# Patient Record
Sex: Female | Born: 1995 | Race: White | Hispanic: No | Marital: Single | Smoking: Former smoker
Health system: Southern US, Community
[De-identification: ages and names within clinical notes are randomized; demographics above are authoritative.]

## PROBLEM LIST (undated history)

## (undated) ENCOUNTER — Inpatient Hospital Stay: Payer: Self-pay

## (undated) DIAGNOSIS — N200 Calculus of kidney: Secondary | ICD-10-CM

## (undated) DIAGNOSIS — R569 Unspecified convulsions: Secondary | ICD-10-CM

## (undated) DIAGNOSIS — F32A Depression, unspecified: Secondary | ICD-10-CM

## (undated) DIAGNOSIS — D649 Anemia, unspecified: Secondary | ICD-10-CM

## (undated) DIAGNOSIS — F419 Anxiety disorder, unspecified: Secondary | ICD-10-CM

## (undated) DIAGNOSIS — J45909 Unspecified asthma, uncomplicated: Secondary | ICD-10-CM

## (undated) DIAGNOSIS — F329 Major depressive disorder, single episode, unspecified: Secondary | ICD-10-CM

## (undated) DIAGNOSIS — Z87442 Personal history of urinary calculi: Secondary | ICD-10-CM

## (undated) DIAGNOSIS — K219 Gastro-esophageal reflux disease without esophagitis: Secondary | ICD-10-CM

## (undated) HISTORY — PX: TONSILLECTOMY: SUR1361

## (undated) HISTORY — PX: WISDOM TOOTH EXTRACTION: SHX21

---
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
## 2015-12-30 ENCOUNTER — Emergency Department: Payer: Self-pay

## 2015-12-30 ENCOUNTER — Encounter: Payer: Self-pay | Admitting: Emergency Medicine

## 2015-12-30 ENCOUNTER — Emergency Department
Admission: EM | Admit: 2015-12-30 | Discharge: 2015-12-30 | Disposition: A | Discharge status: Home / Self Care | Payer: Self-pay | Attending: Emergency Medicine | Admitting: Emergency Medicine

## 2015-12-30 DIAGNOSIS — F17200 Nicotine dependence, unspecified, uncomplicated: Secondary | ICD-10-CM | POA: Insufficient documentation

## 2015-12-30 DIAGNOSIS — O021 Missed abortion: Secondary | ICD-10-CM | POA: Insufficient documentation

## 2015-12-30 LAB — URINALYSIS COMPLETE WITH MICROSCOPIC (ARMC ONLY)
Bacteria, UA: NONE SEEN
Bilirubin Urine: NEGATIVE
Glucose, UA: NEGATIVE mg/dL
Ketones, ur: NEGATIVE mg/dL
Leukocytes, UA: NEGATIVE
Nitrite: NEGATIVE
Protein, ur: NEGATIVE mg/dL
Specific Gravity, Urine: 1.004 — ABNORMAL LOW (ref 1.005–1.030)
WBC, UA: NONE SEEN WBC/hpf (ref 0–5)
pH: 7 (ref 5.0–8.0)

## 2015-12-30 LAB — COMPREHENSIVE METABOLIC PANEL WITH GFR
ALT: 13 U/L — ABNORMAL LOW (ref 14–54)
AST: 24 U/L (ref 15–41)
Albumin: 4 g/dL (ref 3.5–5.0)
Alkaline Phosphatase: 68 U/L (ref 38–126)
Anion gap: 9 (ref 5–15)
BUN: 10 mg/dL (ref 6–20)
CO2: 22 mmol/L (ref 22–32)
Calcium: 9 mg/dL (ref 8.9–10.3)
Chloride: 105 mmol/L (ref 101–111)
Creatinine, Ser: 0.55 mg/dL (ref 0.44–1.00)
GFR calc Af Amer: >60 mL/min
GFR calc non Af Amer: >60 mL/min
Glucose, Bld: 86 mg/dL (ref 65–99)
Potassium: 3.4 mmol/L — ABNORMAL LOW (ref 3.5–5.1)
Sodium: 136 mmol/L (ref 135–145)
Total Bilirubin: 1 mg/dL (ref 0.3–1.2)
Total Protein: 7.1 g/dL (ref 6.5–8.1)

## 2015-12-30 LAB — CBC
HEMATOCRIT: 39.2 % (ref 35.0–47.0)
Hemoglobin: 13.4 g/dL (ref 12.0–16.0)
MCH: 29.7 pg (ref 26.0–34.0)
MCHC: 34.3 g/dL (ref 32.0–36.0)
MCV: 86.7 fL (ref 80.0–100.0)
PLATELETS: 251 10*3/uL (ref 150–440)
RBC: 4.52 MIL/uL (ref 3.80–5.20)
RDW: 13.2 % (ref 11.5–14.5)
WBC: 7.3 10*3/uL (ref 3.6–11.0)

## 2015-12-30 LAB — POCT PREGNANCY, URINE: Preg Test, Ur: NEGATIVE

## 2015-12-30 LAB — HCG, QUANTITATIVE, PREGNANCY: hCG, Beta Chain, Quant, S: 439 m[IU]/mL — ABNORMAL HIGH (ref <5)

## 2015-12-30 NOTE — ED Notes (Signed)
Pt to ed with c/o left lower quad pain, states worse after eating,  Reports she is approx [redacted] weeks pregnant.

## 2015-12-30 NOTE — ED Provider Notes (Signed)
[facility] Emergency Department Provider Note  ____________________________________________    I have reviewed the triage vital signs and the nursing notes.   HISTORY  Chief Complaint Abdominal Pain    HPI Erica Gill is a 20 y.o. female presents with left lower abdominal discomfort for approximately one day. She describes it as cramping and intermittent She believes that she is 5-[redacted] weeks pregnant. She had a positive urine pregnancy 4 days ago. She denies vaginal bleeding. No fevers or chills. This is her first pregnancy     History reviewed. No pertinent past medical history.  There are no active problems to display for this patient.   History reviewed. No pertinent past surgical history.  No current outpatient prescriptions on file.  Allergies Review of patient's allergies indicates no known allergies.  History reviewed. No pertinent family history.  Social History Social History  Substance Use Topics  . Smoking status: Current Every Day Smoker  . Smokeless tobacco: None  . Alcohol Use: No    Review of Systems  Constitutional: Negative for fever. Eyes: Negative for redness ENT: Negative for sore throat Cardiovascular: Negative for chest pain Respiratory: Negative for cough Gastrointestinal: Negative for diarrhea Genitourinary: Negative for dysuria. No vaginal discharge Musculoskeletal: Negative for back pain. Skin: Negative for rash. Neurological: Negative for focal weakness Psychiatric: no anxiety    ____________________________________________   PHYSICAL EXAM:  VITAL SIGNS: ED Triage Vitals  Enc Vitals Group     BP 12/30/15 1243 134/82 mmHg     Pulse Rate 12/30/15 1243 102     Resp 12/30/15 1243 18     Temp 12/30/15 1243 98 F (36.7 C)     Temp Source 12/30/15 1243 Oral     SpO2 12/30/15 1243 100 %     Weight 12/30/15 1243 149 lb (67.586 kg)     Height 12/30/15 1243  (1.549 m)     Head Cir --      Peak  Flow --      Pain Score 12/30/15 1244 4     Pain Loc --      Pain Edu? --      Excl. in GC? --      Constitutional: Alert and oriented. Well appearing and in no distress.  Eyes: Conjunctivae are normal. No erythema or injection ENT   Head: Normocephalic and atraumatic.   Mouth/Throat: Mucous membranes are moist. Cardiovascular: Normal rate, regular rhythm.  Respiratory: Normal respiratory effort without tachypnea nor retractions.  Gastrointestinal: Soft and non-tender in all quadrants. No distention. There is no CVA tenderness. Genitourinary: deferred Musculoskeletal: Nontender with normal range of motion in all extremities. No lower extremity tenderness nor edema. Neurologic:  Normal speech and language. No gross focal neurologic deficits are appreciated. Skin:  Skin is warm, dry and intact. No rash noted. Psychiatric: Mood and affect are normal. Patient exhibits appropriate insight and judgment.  ____________________________________________    LABS (pertinent positives/negatives)  Labs Reviewed  COMPREHENSIVE METABOLIC PANEL - Abnormal; Notable for the following:    Potassium 3.4 (*)    ALT 13 (*)    All other components within normal limits  URINALYSIS COMPLETEWITH MICROSCOPIC ([facility] ONLY) - Abnormal; Notable for the following:    Color, Urine STRAW (*)    APPearance CLEAR (*)    Specific Gravity, Urine 1.004 (*)    Hgb urine dipstick 1+ (*)    Squamous Epithelial / LPF 0-5 (*)    All other components within normal limits  HCG, QUANTITATIVE,  PREGNANCY - Abnormal; Notable for the following:    hCG, Beta Chain, Quant, S 439 (*)    All other components within normal limits  CBC  POC URINE PREG, ED  POCT PREGNANCY, URINE  ABO/RH    ____________________________________________   EKG  None  ____________________________________________    RADIOLOGY  Ultrasound shows no IUP  ____________________________________________   PROCEDURES  Procedure(s)  performed: none  Critical Care performed: none  ____________________________________________   INITIAL IMPRESSION / ASSESSMENT AND PLAN / ED COURSE  Pertinent labs & imaging results that were available during my care of the patient were reviewed by me and considered in my medical decision making (see chart for details).  Discussed case with Dr. Vergie [facility] gynecology. He asks to see the patient as an outpatient and to have repeat beta hCG. She is Rh+. She apparently did develop some vaginal bleeding while in the ED. She appears comfortable in the exam room think is a reasonable plan. I did discuss with her the need to return immediately to the emergency department if worsening pain. I described to her that this may be a very early pregnancy, missed abortion, or ectopic pregnancy.  ____________________________________________   FINAL CLINICAL IMPRESSION(S) / ED DIAGNOSES  Final diagnoses:  Missed abortion          Jene Everyobert Alexei Doswell, MD 12/30/15 2046

## 2015-12-31 LAB — ABO/RH: ABO/RH(D): A POS

## 2016-01-07 ENCOUNTER — Other Ambulatory Visit: Payer: Self-pay | Admitting: Obstetrics and Gynecology

## 2016-01-07 ENCOUNTER — Other Ambulatory Visit
Admission: RE | Admit: 2016-01-07 | Discharge: 2016-01-07 | Disposition: A | Discharge status: Home / Self Care | Payer: Medicaid Other | Source: Ambulatory Visit | Attending: Obstetrics and Gynecology | Admitting: Obstetrics and Gynecology

## 2016-01-07 DIAGNOSIS — Z331 Pregnant state, incidental: Secondary | ICD-10-CM | POA: Insufficient documentation

## 2016-01-07 DIAGNOSIS — O3680X0 Pregnancy with inconclusive fetal viability, not applicable or unspecified: Secondary | ICD-10-CM

## 2016-01-07 LAB — HCG, QUANTITATIVE, PREGNANCY: hCG, Beta Chain, Quant, S: 15 m[IU]/mL — ABNORMAL HIGH (ref <5)

## 2016-10-05 NOTE — L&D Delivery Note (Signed)
Operative Delivery Note At  a viable female was delivered via VAVD kiwi ( see dictated note).  Presentation: vertex; Position: Right,, Occiput,, Anterior; Station: outlet.  Verbal consent: obtained from patient.  Risks and benefits discussed in detail.  Risks include, but are not limited to the risks of anesthesia, bleeding, infection, damage to maternal tissues, fetal cephalhematoma.  There is also the risk of inability to effect vaginal delivery of the head, or shoulder dystocia that cannot be resolved by established maneuvers, leading to the need for emergency cesarean section.  APGAR:8/9 , ; weight  .#8/0   Placenta status: intact, .   Cord:  with the following complications: .  Cord pH: not done  Anesthesia:  CLE  Instruments: Kiwi  Episiotomy:  MLE  Lacerations:  fourth degree laceration  Suture Repair: 2.0 3.0 vicryl Est. Blood Loss (mL):  350 cc  Mom to postpartum.  Baby to Couplet care / Skin to Skin.  Ihor Austin Schermerhorn 06/29/2017, 6:48 AM

## 2016-12-30 LAB — OB RESULTS CONSOLE HEPATITIS B SURFACE ANTIGEN: Hepatitis B Surface Ag: NEGATIVE

## 2016-12-30 LAB — OB RESULTS CONSOLE RUBELLA ANTIBODY, IGM: Rubella: IMMUNE

## 2016-12-30 LAB — OB RESULTS CONSOLE GC/CHLAMYDIA
CHLAMYDIA, DNA PROBE: NEGATIVE
GC PROBE AMP, GENITAL: NEGATIVE

## 2016-12-30 LAB — OB RESULTS CONSOLE HIV ANTIBODY (ROUTINE TESTING): HIV: NONREACTIVE

## 2016-12-30 LAB — OB RESULTS CONSOLE ABO/RH: RH TYPE: POSITIVE

## 2016-12-30 LAB — OB RESULTS CONSOLE ANTIBODY SCREEN: ANTIBODY SCREEN: NEGATIVE

## 2016-12-30 LAB — OB RESULTS CONSOLE VARICELLA ZOSTER ANTIBODY, IGG: Varicella: IMMUNE

## 2016-12-30 LAB — OB RESULTS CONSOLE RPR: RPR: NONREACTIVE

## 2017-04-04 ENCOUNTER — Encounter: Payer: Self-pay | Admitting: Emergency Medicine

## 2017-04-04 ENCOUNTER — Emergency Department
Admission: EM | Admit: 2017-04-04 | Discharge: 2017-04-04 | Disposition: A | Discharge status: Home / Self Care | Payer: Medicaid Other | Attending: Emergency Medicine | Admitting: Emergency Medicine

## 2017-04-04 DIAGNOSIS — F17200 Nicotine dependence, unspecified, uncomplicated: Secondary | ICD-10-CM | POA: Diagnosis not present

## 2017-04-04 DIAGNOSIS — R05 Cough: Secondary | ICD-10-CM | POA: Diagnosis present

## 2017-04-04 DIAGNOSIS — J069 Acute upper respiratory infection, unspecified: Secondary | ICD-10-CM

## 2017-04-04 DIAGNOSIS — O99332 Smoking (tobacco) complicating pregnancy, second trimester: Secondary | ICD-10-CM | POA: Insufficient documentation

## 2017-04-04 DIAGNOSIS — O99512 Diseases of the respiratory system complicating pregnancy, second trimester: Secondary | ICD-10-CM | POA: Diagnosis not present

## 2017-04-04 DIAGNOSIS — Z3A26 26 weeks gestation of pregnancy: Secondary | ICD-10-CM | POA: Insufficient documentation

## 2017-04-04 MED ORDER — AZITHROMYCIN 250 MG PO TABS: ORAL_TABLET | ORAL | 0 refills | Status: DC

## 2017-04-04 MED ORDER — ALBUTEROL SULFATE HFA 108 (90 BASE) MCG/ACT IN AERS: 2.0000 | INHALATION_SPRAY | Freq: Four times a day (QID) | RESPIRATORY_TRACT | 0 refills | Status: DC | PRN

## 2017-04-04 MED ORDER — ALBUTEROL SULFATE (2.5 MG/3ML) 0.083% IN NEBU
2.5000 mg | INHALATION_SOLUTION | Freq: Once | RESPIRATORY_TRACT | Status: AC
  Administered 2017-04-04: 2.5 mg via RESPIRATORY_TRACT
  Filled 2017-04-04: qty 3

## 2017-04-04 NOTE — ED Provider Notes (Signed)
[facility] Emergency Department Provider Note  ____________________________________________  Time seen: Approximately 12:46 PM  I have reviewed the triage vital signs and the nursing notes.   HISTORY  Chief Complaint URI    HPI Erica Gill is a 21 y.o. female that presents to the emergency departmentwith chills, nasal congestion, sore throat, productive cough for 5 days. Patient states that she has had a low-grade fever and the highest temperature has been 98.8. She is coughing up green sputum. No alleviating measures have been attempted. She has been exposed to several people with walking pneumonia. She is [redacted] weeks pregnant. No abdominal pain. She denies chest pain, nausea, vomiting, abdominal pain, vaginal discharge.   History reviewed. No pertinent past medical history.  There are no active problems to display for this patient.   History reviewed. No pertinent surgical history.  Prior to Admission medications   Medication Sig Start Date End Date Taking? Authorizing Provider  albuterol (PROVENTIL HFA;VENTOLIN HFA) 108 (90 Base) MCG/ACT inhaler Inhale 2 puffs into the lungs every 6 (six) hours as needed for wheezing or shortness of breath. 04/04/17   Enid DerryWagner, Elona Yinger, PA-C  azithromycin (ZITHROMAX Z-PAK) 250 MG tablet Take 2 tablets (500 mg) on  Day 1,  followed by 1 tablet (250 mg) once daily on Days 2 through 5. 04/04/17   Enid DerryWagner, Hans Rusher, PA-C    Allergies Patient has no known allergies.  No family history on file.  Social History Social History  Substance Use Topics  . Smoking status: Current Every Day Smoker  . Smokeless tobacco: Never Used  . Alcohol use No     Review of Systems  Constitutional: No chills Eyes: No visual changes. No discharge. ENT: Positive for congestion and rhinorrhea. Cardiovascular: No chest pain. Respiratory: Positive for cough.  Gastrointestinal: No abdominal pain.  No nausea, no vomiting.  No diarrhea.  No  constipation. Skin: Negative for rash, abrasions, lacerations, ecchymosis. Neurological: Negative for headaches.   ____________________________________________   PHYSICAL EXAM:  VITAL SIGNS: ED Triage Vitals  Enc Vitals Group     BP 04/04/17 1008 110/74     Pulse Rate 04/04/17 1008 (!) 108     Resp 04/04/17 1008 16     Temp 04/04/17 1008 98.4 F (36.9 C)     Temp Source 04/04/17 1008 Oral     SpO2 04/04/17 1008 99 %     Weight 04/04/17 0954 160 lb (72.6 kg)     Height 04/04/17 0954 5\' 1"  (1.549 m)     Head Circumference --      Peak Flow --      Pain Score 04/04/17 0954 5     Pain Loc --      Pain Edu? --      Excl. in GC? --      Constitutional: Alert and oriented. Well appearing and in no acute distress. Eyes: Conjunctivae are normal. PERRL. EOMI. No discharge. Head: Atraumatic. ENT: No frontal and maxillary sinus tenderness.      Ears: Tympanic membranes pearly gray with good landmarks. No discharge.      Nose: No congestion/rhinnorhea.      Mouth/Throat: Mucous membranes are moist. Oropharynx non-erythematous. Tonsils not enlarged. No exudates. Uvula midline. Neck: No stridor.   Hematological/Lymphatic/Immunilogical: No cervical lymphadenopathy. Cardiovascular: Normal rate, regular rhythm.  Good peripheral circulation. Respiratory: Normal respiratory effort without tachypnea or retractions. Lungs CTAB. Good air entry to the bases with no decreased or absent breath sounds. Gastrointestinal: Bowel sounds 4 quadrants. Soft  and nontender to palpation. No guarding or rigidity. No palpable masses.  Musculoskeletal: Full range of motion to all extremities. No gross deformities appreciated. Neurologic:  Normal speech and language. No gross focal neurologic deficits are appreciated.  Skin:  Skin is warm, dry and intact. No rash noted.   ____________________________________________   LABS (all labs ordered are listed, but only abnormal results are displayed)  Labs  Reviewed - No data to display ____________________________________________  EKG   ____________________________________________  RADIOLOGY  No results found.  ____________________________________________    PROCEDURES  Procedure(s) performed:    Procedures    Medications  albuterol (PROVENTIL) (2.5 MG/3ML) 0.083% nebulizer solution 2.5 mg (2.5 mg Nebulization Given 04/04/17 1145)     ____________________________________________   INITIAL IMPRESSION / ASSESSMENT AND PLAN / ED COURSE  Pertinent labs & imaging results that were available during my care of the patient were reviewed by me and considered in my medical decision making (see chart for details).  Review of the  [place]RS was performed in accordance of the [place] prior to dispensing any controlled drugs.   Patient's diagnosis is consistent with upper respiratory infection. Vital signs and exam are reassuring. Patient is afebrile. She is [redacted] weeks pregnant. Fetal heart tones heard at 150bpm.  I will cover for bacterial causes. Nebulizer helped her breathing. Patient appears well and is staying well hydrated. Patient should alternate tylenol and ibuprofen for fever. Patient feels comfortable going home. Patient will be discharged home with prescriptions for azithromycin and albuterol. Patient is to follow up with PCP as needed or otherwise directed. Patient is given ED precautions to return to the ED for any worsening or new symptoms.     ____________________________________________  FINAL CLINICAL IMPRESSION(S) / ED DIAGNOSES  Final diagnoses:  Upper respiratory tract infection, unspecified type      NEW MEDICATIONS STARTED DURING THIS VISIT:  Discharge Medication List as of 04/04/2017  1:33 PM    START taking these medications   Details  albuterol (PROVENTIL HFA;VENTOLIN HFA) 108 (90 Base) MCG/ACT inhaler Inhale 2 puffs into the lungs every 6 (six) hours as needed for wheezing or shortness of breath., Starting  Sun 04/04/2017, Print    azithromycin (ZITHROMAX Z-PAK) 250 MG tablet Take 2 tablets (500 mg) on  Day 1,  followed by 1 tablet (250 mg) once daily on Days 2 through 5., Print            This chart was dictated using voice recognition software/Dragon. Despite best efforts to proofread, errors can occur which can change the meaning. Any change was purely unintentional.    Enid Derry, PA-C 04/04/17 1526    Jene Every, MD 04/04/17 [phone]

## 2017-04-04 NOTE — ED Triage Notes (Signed)
C/O generalized body aches, productive cough and sore throat x 3-4 days.  States she has been exposed to people with "walking pneumonia".

## 2017-05-05 ENCOUNTER — Observation Stay
Admission: EM | Admit: 2017-05-05 | Discharge: 2017-05-06 | Disposition: A | Discharge status: Home / Self Care | Payer: Medicaid Other | Attending: Obstetrics and Gynecology | Admitting: Obstetrics and Gynecology

## 2017-05-05 DIAGNOSIS — R1030 Lower abdominal pain, unspecified: Secondary | ICD-10-CM | POA: Insufficient documentation

## 2017-05-05 DIAGNOSIS — Z3A30 30 weeks gestation of pregnancy: Secondary | ICD-10-CM | POA: Insufficient documentation

## 2017-05-05 DIAGNOSIS — O9989 Other specified diseases and conditions complicating pregnancy, childbirth and the puerperium: Principal | ICD-10-CM | POA: Insufficient documentation

## 2017-05-05 HISTORY — DX: Unspecified convulsions: R56.9

## 2017-05-06 ENCOUNTER — Encounter: Payer: Self-pay | Admitting: Obstetrics and Gynecology

## 2017-05-06 DIAGNOSIS — Z3A30 30 weeks gestation of pregnancy: Secondary | ICD-10-CM | POA: Diagnosis not present

## 2017-05-06 DIAGNOSIS — O9989 Other specified diseases and conditions complicating pregnancy, childbirth and the puerperium: Secondary | ICD-10-CM | POA: Diagnosis present

## 2017-05-06 DIAGNOSIS — R1030 Lower abdominal pain, unspecified: Secondary | ICD-10-CM | POA: Diagnosis not present

## 2017-05-06 NOTE — Progress Notes (Signed)
Dr Feliberto GottronSchermerhorn notified of pt arrival, compliant, FHR tracing and CTX. Orders received to exam cervix and if closed, discharge pt home.

## 2017-05-06 NOTE — OB Triage Note (Signed)
Pt arrived to L&D with c/o lower abdominal pain that radiates to the R lower back. Pt reports N/V yesterday and throughout previous night but none today. Pt denies vaginal bleeding, leaking of fluid, or decreased fetal movement at this time. Toco and EFM monitors placed and explained. Will monitor fetal and maternal well-being.

## 2017-05-06 NOTE — Progress Notes (Signed)
Pt discharged home in stable condition with sister. Discharge instructions given and discussed. Pt denies questions or concerns at this time. Pt is to follow-up with next prenatal appointment.

## 2017-05-06 NOTE — Discharge Instructions (Signed)
Please call or return to [facility] with any of the following: -Regular contractions -Vaginal Bleeding -Leaking of Fluid -Decreased Fetal Movement

## 2017-05-14 ENCOUNTER — Observation Stay
Admission: EM | Admit: 2017-05-14 | Discharge: 2017-05-15 | Disposition: A | Discharge status: Home / Self Care | Payer: Medicaid Other | Attending: Obstetrics and Gynecology | Admitting: Obstetrics and Gynecology

## 2017-05-14 ENCOUNTER — Encounter: Payer: Self-pay | Admitting: *Deleted

## 2017-05-14 DIAGNOSIS — O99343 Other mental disorders complicating pregnancy, third trimester: Secondary | ICD-10-CM | POA: Insufficient documentation

## 2017-05-14 DIAGNOSIS — F419 Anxiety disorder, unspecified: Secondary | ICD-10-CM | POA: Insufficient documentation

## 2017-05-14 DIAGNOSIS — N132 Hydronephrosis with renal and ureteral calculous obstruction: Secondary | ICD-10-CM | POA: Diagnosis not present

## 2017-05-14 DIAGNOSIS — F329 Major depressive disorder, single episode, unspecified: Secondary | ICD-10-CM | POA: Insufficient documentation

## 2017-05-14 DIAGNOSIS — N390 Urinary tract infection, site not specified: Secondary | ICD-10-CM | POA: Insufficient documentation

## 2017-05-14 DIAGNOSIS — Z87442 Personal history of urinary calculi: Secondary | ICD-10-CM | POA: Insufficient documentation

## 2017-05-14 DIAGNOSIS — O99333 Smoking (tobacco) complicating pregnancy, third trimester: Secondary | ICD-10-CM | POA: Insufficient documentation

## 2017-05-14 DIAGNOSIS — J45909 Unspecified asthma, uncomplicated: Secondary | ICD-10-CM | POA: Insufficient documentation

## 2017-05-14 DIAGNOSIS — F17210 Nicotine dependence, cigarettes, uncomplicated: Secondary | ICD-10-CM | POA: Diagnosis not present

## 2017-05-14 DIAGNOSIS — O99513 Diseases of the respiratory system complicating pregnancy, third trimester: Secondary | ICD-10-CM | POA: Diagnosis not present

## 2017-05-14 DIAGNOSIS — N200 Calculus of kidney: Secondary | ICD-10-CM

## 2017-05-14 DIAGNOSIS — Z3A32 32 weeks gestation of pregnancy: Secondary | ICD-10-CM | POA: Diagnosis not present

## 2017-05-14 DIAGNOSIS — O9989 Other specified diseases and conditions complicating pregnancy, childbirth and the puerperium: Principal | ICD-10-CM | POA: Insufficient documentation

## 2017-05-14 HISTORY — DX: Unspecified asthma, uncomplicated: J45.909

## 2017-05-14 HISTORY — DX: Major depressive disorder, single episode, unspecified: F32.9

## 2017-05-14 HISTORY — DX: Depression, unspecified: F32.A

## 2017-05-14 HISTORY — DX: Anxiety disorder, unspecified: F41.9

## 2017-05-14 HISTORY — DX: Anemia, unspecified: D64.9

## 2017-05-14 HISTORY — DX: Calculus of kidney: N20.0

## 2017-05-14 LAB — COMPREHENSIVE METABOLIC PANEL
ALK PHOS: 103 U/L (ref 38–126)
ALT: 18 U/L (ref 14–54)
ANION GAP: 7 (ref 5–15)
AST: 25 U/L (ref 15–41)
Albumin: 2.5 g/dL — ABNORMAL LOW (ref 3.5–5.0)
BILIRUBIN TOTAL: 1.1 mg/dL (ref 0.3–1.2)
BUN: <5 mg/dL — ABNORMAL LOW (ref 6–20)
CALCIUM: 8.4 mg/dL — AB (ref 8.9–10.3)
CO2: 23 mmol/L (ref 22–32)
Chloride: 106 mmol/L (ref 101–111)
Creatinine, Ser: 0.45 mg/dL (ref 0.44–1.00)
GFR calc non Af Amer: >60 mL/min (ref >60)
Glucose, Bld: 88 mg/dL (ref 65–99)
Potassium: 3.5 mmol/L (ref 3.5–5.1)
SODIUM: 136 mmol/L (ref 135–145)
TOTAL PROTEIN: 5.8 g/dL — AB (ref 6.5–8.1)

## 2017-05-14 LAB — URINALYSIS, ROUTINE W REFLEX MICROSCOPIC
BILIRUBIN URINE: NEGATIVE
GLUCOSE, UA: NEGATIVE mg/dL
Ketones, ur: 5 mg/dL — AB
LEUKOCYTES UA: NEGATIVE
NITRITE: NEGATIVE
PH: 6 (ref 5.0–8.0)
Protein, ur: 100 mg/dL — AB
SPECIFIC GRAVITY, URINE: 1.018 (ref 1.005–1.030)
Squamous Epithelial / LPF: NONE SEEN

## 2017-05-14 MED ORDER — LACTATED RINGERS IV SOLN: 500.0000 mL | INTRAVENOUS | Status: DC | PRN

## 2017-05-14 MED ORDER — LACTATED RINGERS IV SOLN
INTRAVENOUS | Status: DC
  Administered 2017-05-14 (×2): via INTRAVENOUS

## 2017-05-14 MED ORDER — ACETAMINOPHEN 325 MG PO TABS
650.0000 mg | ORAL_TABLET | ORAL | Status: DC | PRN
Start: 2017-05-14 — End: 2017-05-15

## 2017-05-14 MED ORDER — OXYCODONE-ACETAMINOPHEN 5-325 MG PO TABS: 1.0000 | ORAL_TABLET | ORAL | Status: DC | PRN

## 2017-05-14 NOTE — Discharge Summary (Signed)
  Patient ID: Erica Gill, female   DOB: 09/10/1996, 20 y.o.   MRN: [id number] Pt arrived to L&D with c/o lower abdominal pain that radiates to the R lower back. Pt reports N/V yesterday and throughout previous night but none today. Pt denies vaginal bleeding, leaking of fluid, or decreased fetal movement at this time. Toco and EFM monitors placed and explained. Will monitor fetal and maternal well-being.   no labor Reassuring fetal monitoring

## 2017-05-14 NOTE — H&P (Signed)
Erica Gill is a 21 y.o. female presenting for "lower abd pain since today" starting mid afternoon (2pm) as a dull pain and becoming more uncomfortable this pm at 4pm  radiating towards the back mostly on the right side. No N,V,C,D or fever, aching, chills or sweats.  [facility] with LMP of 10/01/16 & EDD of 07/08/17. Pt has been working and states she has been drinking water but, her urine on the voided speciman is coffee colored.  OB History    Gravida Para Term Preterm AB Living   4 0 0 0 3 0   SAB TAB Ectopic Multiple Live Births   3 0 0 0 0     Past Medical History:  Diagnosis Date  . Anemia   . Anxiety   . Asthma   . Depression   . Kidney stones   . Seizures (HCC)    Past Surgical History:  Procedure Laterality Date  . TONSILLECTOMY    . WISDOM TOOTH EXTRACTION     Family History: family history includes Thrombosis in her mother. Social History:  reports that she has been smoking Cigarettes.  She has been smoking about 0.50 packs per day. She has never used smokeless tobacco. She reports that she does not drink alcohol or use drugs.     Maternal Diabetes:86 Genetic Screening: to late for 1st trimester, AFP neg Maternal Ultrasounds/Referrals: US:no report found  Fetal Ultrasounds or other Referrals: N/A Maternal Substance Abuse:  None Significant Maternal Medications:  PNV Significant Maternal Lab Results: GC/CH neg Other Comments:    Review of Systems  Constitutional: Negative.   HENT: Negative.   Eyes: Negative.   Respiratory: Negative.   Cardiovascular: Negative.   Gastrointestinal: Positive for abdominal pain.  Genitourinary: Negative.   Musculoskeletal: Negative.   Skin: Negative.   Neurological: Negative.   Endo/Heme/Allergies: Negative.   Psychiatric/Behavioral: Negative.    History  PMH: kidney stones,  Temperature 98.7 F (37.1 C), temperature source Oral, resp. rate 18, height 5\' 1"  (1.549 m), weight 74.4 kg (164 lb). Exam Physical Exam   Gen:20 yo white female in NAD HEENT: Eyes non-icteric, Neck supple Heart: S1S2, RRR, No M/R/G Lungs:CTA bilat, no W/R/R Abd:TTP over the RLQ, Gravid. Extrems:no edema, DTR's 1+/0 Prenatal labs: ABO, Rh:  A pos Antibody:  neg  Rubella:  Immune RPR:   NR HBsAg:   Neg HIV:   neg GBS:   not tested  Assessment/Plan: A:1. IUP at 32 1/7 weeks 2. Lower abd pain 3. Kidney stones  Sharee PimpleCaron W Payton Moder 05/14/2017, 10:45 PM

## 2017-05-14 NOTE — Progress Notes (Signed)
Patient ID: Erica Gill, female   DOB: 01/19/1996, 20 y.o.   MRN: [id number] Urine reviewed with mod amt of RBC' and WBC's. Likely kidney stones as pt said she popped many of them out on Tues. May also be partially obstructed. Will treat with antibiotics also.

## 2017-05-14 NOTE — Progress Notes (Signed)
Erica Gill, CNM notified of pt arrival to unit and pt complaint. CNM notifiied of FHR tracing, CTX pattern, and hx of kidney stones. CNM to evaluate pt at bedside.

## 2017-05-14 NOTE — OB Triage Note (Signed)
Pt arrived to OBS 3 for evaluation of abdominal pain since around 2 pm today. Pt reports the pain starting out as dull then around 4 while the pt was working it got stronger. Pt reports positive fetal movement but decreased today. Pt denies any vaginal bleeding or leaking of fluid. EFM and TOCO applied.

## 2017-05-15 ENCOUNTER — Observation Stay: Payer: Medicaid Other

## 2017-05-15 DIAGNOSIS — O9989 Other specified diseases and conditions complicating pregnancy, childbirth and the puerperium: Secondary | ICD-10-CM | POA: Diagnosis not present

## 2017-05-15 MED ORDER — SODIUM CHLORIDE 0.9 % IV SOLN
INTRAVENOUS | Status: DC
  Administered 2017-05-15: 01:00:00 via INTRAVENOUS

## 2017-05-15 MED ORDER — NITROFURANTOIN MONOHYD MACRO 100 MG PO CAPS: 100.0000 mg | ORAL_CAPSULE | Freq: Two times a day (BID) | ORAL | 0 refills | Status: AC

## 2017-05-15 MED ORDER — DEXTROSE 5 % IV SOLN
1.0000 g | Freq: Once | INTRAVENOUS | Status: AC
  Administered 2017-05-15: 1 g via INTRAVENOUS
  Filled 2017-05-15: qty 10

## 2017-05-15 NOTE — Discharge Summary (Signed)
Obstetric Discharge Summary Reason for Admission: abd pain radiating to Rt lower back  Prenatal Procedures: See notes Intrapartum Procedures: N/A Postpartum Procedures: N/A Complications-Operative and Postpartum: N/A Hemoglobin  Date Value Ref Range Status  12/30/2015 13.4 12.0 - 16.0 g/dL Final   HGB  Date Value Ref Range Status  04/25/2013 13.4 12.0 - 16.0 g/dL Final   HCT  Date Value Ref Range Status  12/30/2015 39.2 35.0 - 47.0 % Final  04/25/2013 39.6 35.0 - 47.0 % Final    Physical Exam:  General: A,A,O x 3 Heart:S1S2,RRR, no M/R/G. Lungs: CTA bilat, no W/R/R. ABd:TTP over the Rt lower quad rad to Rt back,Gravid Extrems: Neg Homans   Discharge Diagnoses IUP at 32 2/7 weeks  Discharge Information: Date: 05/15/2017 Activity: UP as normal  Diet: See kidney stone diet  Medications: PNV and Iron Condition: stable Instructions: Take antibiotic as instructed, Push po fluids, Tylenol for discomfort Discharge to: home  Follow-up Information    [facility].   Why:  Follow up with next prenatal appointment on Thursday 8/16 Contact information: [address] [facility] [place] [id number] [phone]         Sharee PimpleCaron W Drayven Marchena 05/15/2017, 4:00 AM

## 2017-05-15 NOTE — Progress Notes (Signed)
Pt discharged home in stable condition with sister. Pt instructed to follow up with next prenatal appt. Pt given discharge instructions and instructed pt to pick up new prescription today and begin taking it. Pt denies any questions or concerns at this time.

## 2017-05-15 NOTE — Progress Notes (Signed)
Pharmacy Antibiotic Note  Glennon MacHarley L Charters is a 21 y.o. female admitted on 05/14/2017 with UTI.  Pharmacy has been consulted for ceftriaxone dosing.  Plan: Ceftriaxone 1 gram x1 IV ordered per conversation with CNM.  Height: 5\' 1"  (154.9 cm) Weight: 164 lb (74.4 kg) IBW/kg (Calculated) : 47.8  Temp (24hrs), Avg:98.7 F (37.1 C), Min:98.7 F (37.1 C), Max:98.7 F (37.1 C)   Recent Labs Lab 05/14/17 2245  CREATININE 0.45    Estimated Creatinine Clearance: 103.4 mL/min (by C-G formula based on SCr of 0.45 mg/dL).    No Known Allergies  Antimicrobials this admission: ceftriaxone x1 >>    >>   Dose adjustments this admission:   Microbiology results: 8/10 UCx: pending       8/10 UA: LE(-) NO2(-) WBC TNTC  Thank you for allowing pharmacy to be a part of this patient's care.  Yana Schorr S 05/15/2017 12:25 AM

## 2017-05-15 NOTE — Discharge Instructions (Signed)
Dietary Guidelines to Help Prevent Kidney Stones Kidney stones are deposits of minerals and salts that form inside your kidneys. Your risk of developing kidney stones may be greater depending on your diet, your lifestyle, the medicines you take, and whether you have certain medical conditions. Most people can reduce their chances of developing kidney stones by following the instructions below. Depending on your overall health and the type of kidney stones you tend to develop, your dietitian may give you more specific instructions. What are tips for following this plan? Reading food labels   Choose foods with "no salt added" or "low-salt" labels. Limit your sodium intake to less than 1500 mg per day.  Choose foods with calcium for each meal and snack. Try to eat about 300 mg of calcium at each meal. Foods that contain 200-500 mg of calcium per serving include:  8 oz (237 ml) of milk, fortified nondairy milk, and fortified fruit juice.  8 oz (237 ml) of kefir, yogurt, and soy yogurt.  4 oz (118 ml) of tofu.  1 oz of cheese.  1 cup (300 g) of dried figs.  1 cup (91 g) of cooked broccoli.  1-3 oz can of sardines or mackerel.  Most people need 1000 to 1500 mg of calcium each day. Talk to your dietitian about how much calcium is recommended for you. Shopping   Buy plenty of fresh fruits and vegetables. Most people do not need to avoid fruits and vegetables, even if they contain nutrients that may contribute to kidney stones.  When shopping for convenience foods, choose:  Whole pieces of fruit.  Premade salads with dressing on the side.  Low-fat fruit and yogurt smoothies.  Avoid buying frozen meals or prepared deli foods.  Look for foods with live cultures, such as yogurt and kefir. Cooking   Do not add salt to food when cooking. Place a salt shaker on the table and allow each person to add his or her own salt to taste.  Use vegetable protein, such as beans, textured vegetable  protein (TVP), or tofu instead of meat in pasta, casseroles, and soups. Meal planning   Eat less salt, if told by your dietitian. To do this:  Avoid eating processed or premade food.  Avoid eating fast food.  Eat less animal protein, including cheese, meat, poultry, or fish, if told by your dietitian. To do this:  Limit the number of times you have meat, poultry, fish, or cheese each week. Eat a diet free of meat at least 2 days a week.  Eat only one serving each day of meat, poultry, fish, or seafood.  When you prepare animal protein, cut pieces into small portion sizes. For most meat and fish, one serving is about the size of one deck of cards.  Eat at least 5 servings of fresh fruits and vegetables each day. To do this:  Keep fruits and vegetables on hand for snacks.  Eat 1 piece of fruit or a handful of berries with breakfast.  Have a salad and fruit at lunch.  Have two kinds of vegetables at dinner.  Limit foods that are high in a substance called oxalate. These include:  Spinach.  Rhubarb.  Beets.  Potato chips and french fries.  Nuts.  If you regularly take a diuretic medicine, make sure to eat at least 1-2 fruits or vegetables high in potassium each day. These include:  Avocado.  Banana.  Orange, prune, carrot, or tomato juice.  Baked potato.  Cabbage.    Beans and split peas. General instructions   Drink enough fluid to keep your urine clear or pale yellow. This is the most important thing you can do.  Talk to your health care provider and dietitian about taking daily supplements. Depending on your health and the cause of your kidney stones, you may be advised:  Not to take supplements with vitamin C.  To take a calcium supplement.  To take a daily probiotic supplement.  To take other supplements such as magnesium, fish oil, or vitamin B6.  Take all medicines and supplements as told by your health care provider.  Limit alcohol intake to no  more than 1 drink a day for nonpregnant women and 2 drinks a day for men. One drink equals 12 oz of beer, 5 oz of wine, or 1 oz of hard liquor.  Lose weight if told by your health care provider. Work with your dietitian to find strategies and an eating plan that works best for you. What foods are not recommended? Limit your intake of the following foods, or as told by your dietitian. Talk to your dietitian about specific foods you should avoid based on the type of kidney stones and your overall health. Grains  Breads. Bagels. Rolls. Baked goods. Salted crackers. Cereal. Pasta. Vegetables  Spinach. Rhubarb. Beets. Canned vegetables. Pickles. Olives. Meats and other protein foods  Nuts. Nut butters. Large portions of meat, poultry, or fish. Salted or cured meats. Deli meats. Hot dogs. Sausages. Dairy  Cheese. Beverages  Regular soft drinks. Regular vegetable juice. Seasonings and other foods  Seasoning blends with salt. Salad dressings. Canned soups. Soy sauce. Ketchup. Barbecue sauce. Canned pasta sauce. Casseroles. Pizza. Lasagna. Frozen meals. Potato chips. French fries. Summary  You can reduce your risk of kidney stones by making changes to your diet.  The most important thing you can do is drink enough fluid. You should drink enough fluid to keep your urine clear or pale yellow.  Ask your health care provider or dietitian how much protein from animal sources you should eat each day, and also how much salt and calcium you should have each day. This information is not intended to replace advice given to you by your health care provider. Make sure you discuss any questions you have with your health care provider. Document Released: 01/16/2011 Document Revised: 09/01/2016 Document Reviewed: 09/01/2016 Elsevier Interactive Patient Education  2017 [employer]  

## 2017-05-15 NOTE — Progress Notes (Signed)
Erica Gill, CNM notified of renal u/s results. Orders received for discharge. CNM to come discuss results and POC with pt.

## 2017-05-15 NOTE — Progress Notes (Signed)
Erica Gill is a 21 y.o. G4P0030 at 532w5d  admitted for lower abd pain rad to the Rt lower back  Subjective: "I feel better now"  Objective: BP (!) 102/57 (BP Location: Right Arm)   Pulse (!) 110   Temp 98.4 F (36.9 C) (Oral)   Resp 18   Ht 5\' 1"  (1.549 m)   Wt 74.4 kg (164 lb)   BMI 30.99 kg/m  No intake/output data recorded. No intake/output data recorded.  FHT: 145 UC:  rare} SVE: Not evaluated      Labs: Lab Results  Component Value Date   WBC 7.3 12/30/2015   HGB 13.4 12/30/2015   HCT 39.2 12/30/2015   MCV 86.7 12/30/2015   PLT 251 12/30/2015   US:mod Rt sided hydroureteronephrosis with non-obstucting renal calculi up to 6 mm in the upper and lower pole. Hydronephrosis noted. Lt kidney: Mild Lt sided hydronephrosis with non-obstucting 4.628mm calculus in the upper pole,  Assessment / Plan: A: IUP at 32 2/7 weeks with renal calculit 2. UTI due to hematuria and large WBC's P: Will start on Macrobid 100 mg po bid x 7 days 2. Push po fluids. 3. [facility]. 4. Pt may need to see urology for consult 5. FU in ER  For further pain or worsening of S/S.     Sharee PimpleCaron W Alithea Lapage 05/15/2017, 3:47 AM

## 2017-05-15 NOTE — OB Triage Note (Signed)
[facility]   Take medication as prescribed by CNM.

## 2017-05-16 LAB — URINE CULTURE

## 2017-06-17 LAB — OB RESULTS CONSOLE GBS: STREP GROUP B AG: NEGATIVE

## 2017-06-17 LAB — OB RESULTS CONSOLE RPR: RPR: NONREACTIVE

## 2017-06-28 ENCOUNTER — Inpatient Hospital Stay: Payer: Medicaid Other | Admitting: Anesthesiology

## 2017-06-28 ENCOUNTER — Inpatient Hospital Stay
Admission: EM | Admit: 2017-06-28 | Discharge: 2017-07-01 | DRG: 775 | Disposition: A | Discharge status: Home / Self Care | Payer: Medicaid Other | Attending: Obstetrics and Gynecology | Admitting: Obstetrics and Gynecology

## 2017-06-28 DIAGNOSIS — Z23 Encounter for immunization: Secondary | ICD-10-CM | POA: Diagnosis not present

## 2017-06-28 DIAGNOSIS — O99334 Smoking (tobacco) complicating childbirth: Principal | ICD-10-CM | POA: Diagnosis present

## 2017-06-28 DIAGNOSIS — O9952 Diseases of the respiratory system complicating childbirth: Secondary | ICD-10-CM | POA: Diagnosis present

## 2017-06-28 DIAGNOSIS — Z87442 Personal history of urinary calculi: Secondary | ICD-10-CM | POA: Diagnosis not present

## 2017-06-28 DIAGNOSIS — Z3490 Encounter for supervision of normal pregnancy, unspecified, unspecified trimester: Secondary | ICD-10-CM

## 2017-06-28 DIAGNOSIS — J45909 Unspecified asthma, uncomplicated: Secondary | ICD-10-CM | POA: Diagnosis present

## 2017-06-28 DIAGNOSIS — Z3A39 39 weeks gestation of pregnancy: Secondary | ICD-10-CM | POA: Diagnosis not present

## 2017-06-28 DIAGNOSIS — F17210 Nicotine dependence, cigarettes, uncomplicated: Secondary | ICD-10-CM | POA: Diagnosis present

## 2017-06-28 DIAGNOSIS — O26893 Other specified pregnancy related conditions, third trimester: Secondary | ICD-10-CM | POA: Diagnosis present

## 2017-06-28 LAB — CBC
HCT: 34.9 % — ABNORMAL LOW (ref 35.0–47.0)
HEMOGLOBIN: 12.2 g/dL (ref 12.0–16.0)
MCH: 30.8 pg (ref 26.0–34.0)
MCHC: 34.9 g/dL (ref 32.0–36.0)
MCV: 88.3 fL (ref 80.0–100.0)
PLATELETS: 182 10*3/uL (ref 150–440)
RBC: 3.95 MIL/uL (ref 3.80–5.20)
RDW: 14.8 % — AB (ref 11.5–14.5)
WBC: 7 10*3/uL (ref 3.6–11.0)

## 2017-06-28 LAB — TYPE AND SCREEN
ABO/RH(D): A POS
ANTIBODY SCREEN: NEGATIVE

## 2017-06-28 LAB — CHLAMYDIA/NGC RT PCR (ARMC ONLY)
CHLAMYDIA TR: NOT DETECTED
N GONORRHOEAE: NOT DETECTED

## 2017-06-28 LAB — ROM PLUS (ARMC ONLY): Rom Plus: POSITIVE

## 2017-06-28 LAB — RAPID HIV SCREEN (HIV 1/2 AB+AG)
HIV 1/2 Antibodies: NONREACTIVE
HIV-1 P24 Antigen - HIV24: NONREACTIVE

## 2017-06-28 MED ORDER — OXYTOCIN 40 UNITS IN LACTATED RINGERS INFUSION - SIMPLE MED
INTRAVENOUS | Status: AC
  Administered 2017-06-28: 1 m[IU]/min via INTRAVENOUS
  Filled 2017-06-28: qty 1000

## 2017-06-28 MED ORDER — PHENYLEPHRINE 40 MCG/ML (10ML) SYRINGE FOR IV PUSH (FOR BLOOD PRESSURE SUPPORT)
80.0000 ug | PREFILLED_SYRINGE | INTRAVENOUS | Status: DC | PRN
  Filled 2017-06-28: qty 5

## 2017-06-28 MED ORDER — ACETAMINOPHEN 325 MG PO TABS: 650.0000 mg | ORAL_TABLET | ORAL | Status: DC | PRN

## 2017-06-28 MED ORDER — ZOLPIDEM TARTRATE 5 MG PO TABS
5.0000 mg | ORAL_TABLET | Freq: Every evening | ORAL | Status: DC | PRN
  Administered 2017-06-28: 5 mg via ORAL
  Filled 2017-06-28: qty 1

## 2017-06-28 MED ORDER — SODIUM CHLORIDE 0.9 % IV SOLN
INTRAVENOUS | Status: DC | PRN
  Administered 2017-06-28 (×2): 5 mL via EPIDURAL

## 2017-06-28 MED ORDER — OXYTOCIN BOLUS FROM INFUSION
500.0000 mL | Freq: Once | INTRAVENOUS | Status: AC
  Administered 2017-06-29: 500 mL via INTRAVENOUS

## 2017-06-28 MED ORDER — ONDANSETRON HCL 4 MG/2ML IJ SOLN
4.0000 mg | Freq: Four times a day (QID) | INTRAMUSCULAR | Status: DC | PRN
  Administered 2017-06-29: 4 mg via INTRAVENOUS
  Filled 2017-06-28: qty 2

## 2017-06-28 MED ORDER — MISOPROSTOL 200 MCG PO TABS
ORAL_TABLET | ORAL | Status: AC
  Filled 2017-06-28: qty 4

## 2017-06-28 MED ORDER — SOD CITRATE-CITRIC ACID 500-334 MG/5ML PO SOLN: 30.0000 mL | ORAL | Status: DC | PRN

## 2017-06-28 MED ORDER — LIDOCAINE HCL (PF) 1 % IJ SOLN
INTRAMUSCULAR | Status: DC | PRN
  Administered 2017-06-28 (×2): 1 mL via INTRADERMAL

## 2017-06-28 MED ORDER — BUTORPHANOL TARTRATE 1 MG/ML IJ SOLN
1.0000 mg | INTRAMUSCULAR | Status: DC | PRN
  Filled 2017-06-28: qty 1

## 2017-06-28 MED ORDER — LACTATED RINGERS IV SOLN
INTRAVENOUS | Status: DC
  Administered 2017-06-28 (×2): via INTRAVENOUS

## 2017-06-28 MED ORDER — BUTORPHANOL TARTRATE 2 MG/ML IJ SOLN
1.0000 mg | INTRAMUSCULAR | Status: DC | PRN
  Administered 2017-06-28 (×2): 2 mg via INTRAVENOUS
  Filled 2017-06-28: qty 1

## 2017-06-28 MED ORDER — EPHEDRINE 5 MG/ML INJ
10.0000 mg | INTRAVENOUS | Status: DC | PRN
  Filled 2017-06-28: qty 2

## 2017-06-28 MED ORDER — OXYTOCIN 10 UNIT/ML IJ SOLN
INTRAMUSCULAR | Status: AC
  Filled 2017-06-28: qty 2

## 2017-06-28 MED ORDER — LIDOCAINE HCL (PF) 1 % IJ SOLN
INTRAMUSCULAR | Status: AC
  Filled 2017-06-28: qty 30

## 2017-06-28 MED ORDER — LIDOCAINE-EPINEPHRINE (PF) 1.5 %-1:200000 IJ SOLN
INTRAMUSCULAR | Status: DC | PRN
  Administered 2017-06-28: 3 mL via EPIDURAL

## 2017-06-28 MED ORDER — FENTANYL 2.5 MCG/ML W/ROPIVACAINE 0.15% IN NS 100 ML EPIDURAL (ARMC)
EPIDURAL | Status: AC
  Filled 2017-06-28: qty 100

## 2017-06-28 MED ORDER — SODIUM CHLORIDE 0.9 % IV SOLN: 2.0000 g | Freq: Once | INTRAVENOUS | Status: DC

## 2017-06-28 MED ORDER — AMMONIA AROMATIC IN INHA
RESPIRATORY_TRACT | Status: AC
  Filled 2017-06-28: qty 10

## 2017-06-28 MED ORDER — INFLUENZA VAC SPLIT QUAD 0.5 ML IM SUSY
0.5000 mL | PREFILLED_SYRINGE | INTRAMUSCULAR | Status: AC
  Administered 2017-07-01: 0.5 mL via INTRAMUSCULAR
  Filled 2017-06-28 (×2): qty 0.5

## 2017-06-28 MED ORDER — TERBUTALINE SULFATE 1 MG/ML IJ SOLN: 0.2500 mg | Freq: Once | INTRAMUSCULAR | Status: DC | PRN

## 2017-06-28 MED ORDER — OXYTOCIN 40 UNITS IN LACTATED RINGERS INFUSION - SIMPLE MED
1.0000 m[IU]/min | INTRAVENOUS | Status: DC
  Administered 2017-06-28: 1 m[IU]/min via INTRAVENOUS
  Filled 2017-06-28: qty 1000

## 2017-06-28 MED ORDER — LACTATED RINGERS IV SOLN: 500.0000 mL | INTRAVENOUS | Status: DC | PRN

## 2017-06-28 MED ORDER — FENTANYL 2.5 MCG/ML W/ROPIVACAINE 0.15% IN NS 100 ML EPIDURAL (ARMC)
12.0000 mL/h | EPIDURAL | Status: DC
  Administered 2017-06-28: 12 mL/h via EPIDURAL

## 2017-06-28 MED ORDER — LACTATED RINGERS IV SOLN: 500.0000 mL | Freq: Once | INTRAVENOUS | Status: DC

## 2017-06-28 MED ORDER — DIPHENHYDRAMINE HCL 50 MG/ML IJ SOLN: 12.5000 mg | INTRAMUSCULAR | Status: DC | PRN

## 2017-06-28 MED ORDER — LIDOCAINE HCL (PF) 1 % IJ SOLN: 30.0000 mL | INTRAMUSCULAR | Status: DC | PRN

## 2017-06-28 NOTE — Progress Notes (Signed)
Pt arrived to LD triage with c/o water broke around 0405 after she sat up in bed, void and had large gush, clear,  continues having small trickling clear fluid with mucus discharge, occasional contractions,mild, tolerable and decrease fetal movement. Says she has felt baby move once since arriving at hospital. Denies painful regular contractions, bleeding, spotting, nausea/vomiting or urinary sym. Last seen at [facility] clinic this past Fri, cervix 2/100/head low.

## 2017-06-28 NOTE — H&P (Signed)
Erica Gill is a 22 y.o. female presenting for SROM at 0403 clear fluid today, No S/S of labor, no VB, no decreased FM or UC's. PNC at [facility] significant for LMP 10/01/17  And EDD of 07/08/17.  OB History    Gravida Para Term Preterm AB Living   5 0 0 0 4 0   SAB TAB Ectopic Multiple Live Births   4 0 0 0 0     Past Medical History:  Diagnosis Date  . Anemia   . Anxiety   . Asthma   . Depression   . Kidney stones   . Seizures (HCC)    Past Surgical History:  Procedure Laterality Date  . TONSILLECTOMY    . WISDOM TOOTH EXTRACTION    FMH: . Thyroid disease Mother  . Diabetes Maternal Grandmother  . High blood pressure (Hypertension) Maternal Grandmother  . Stroke Maternal Grandmother  . Diabetes Maternal Grandfather  . Diabetes Paternal Grandmother  . Diabetes Paternal Grandfather  . Pancreatic cancer Other  Pancreatic cancer:Mat great uncle, Mom died of blood clot to the heart age 58 (drug abuse)  Social History: reports that she has been smoking. She has never used smokeless tobacco. She reports that she does not drink alcohol or use drugs. Smoking 7 cigs a day, trying to quit.  Family History: family history includes Thrombosis in her mother.  Social History:  reports that she has been smoking Cigarettes.  She has been smoking about 0.50 packs per day. She has never used smokeless tobacco. She reports that she does not drink alcohol or use drugs.     Maternal Diabetes: No Genetic Screening: Normal Maternal Ultrasounds/Referrals: Normal Fetal Ultrasounds or other Referrals:  None Maternal Substance Abuse:  No Significant Maternal Medications:  PNV, Fe Significant Maternal Lab Results:  None Other Comments:  None  Review of Systems  Constitutional: Negative.   Eyes: Negative.   Respiratory: Negative.   Cardiovascular: Negative.   Gastrointestinal: Negative.   Genitourinary: Negative.   Musculoskeletal: Negative.   Skin: Negative.   Neurological: Negative.    Endo/Heme/Allergies: Negative.   Psychiatric/Behavioral: Negative.    History Dilation: 1 Effacement (%): 60 Station: -2 Exam by:: N. Aten, RN Blood pressure 110/71, pulse 95, temperature 97.8 F (36.6 C), temperature source Oral, resp. rate 18, height  (1.549 m), weight 76.7 kg (169 lb). Exam Physical Exam  Gen:20 yo white female in NAD HEENT: Normocephalic, Eyes non-icteric LUngs: CTA bilat, no W/R/R HEART:S1S@, NO M/R/G QMV:HQIONG Extrems: 1+/0, no Edema 1+ Prenatal labs: ABO, Rh:  A pos Antibody:  Neg Rubella:  Immune RPR:   NR HBsAg:   Neg HIV:   Neg GBS:   Neg AFP:1 in 1150 DSR Glucose:114 Assessment/Plan: A:1. IUP at term 2. SROM at 0403am 3. GBS neg 4. Tobacco Abuse P:1. Admit to Birthplace 2. Disc Pitocin and pt agrees to start 3. Plans epidural.   Sharee Pimple 06/28/2017, 7:59 AM

## 2017-06-28 NOTE — Anesthesia Preprocedure Evaluation (Signed)
Anesthesia Evaluation  Patient identified by MRN, date of birth, ID band Patient awake    Reviewed: Allergy & Precautions, H&P , NPO status , Patient's Chart, lab work & pertinent test results  History of Anesthesia Complications Negative for: history of anesthetic complications  Airway Mallampati: III  TM Distance: >3 FB Neck ROM: full    Dental  (+) Poor Dentition   Pulmonary neg shortness of breath, asthma , Current Smoker,           Cardiovascular Exercise Tolerance: Good hypertension,      Neuro/Psych Seizures -,  PSYCHIATRIC DISORDERS Anxiety Depression    GI/Hepatic negative GI ROS,   Endo/Other    Renal/GU Renal disease  negative genitourinary   Musculoskeletal   Abdominal   Peds  Hematology negative hematology ROS (+)   Anesthesia Other Findings Patient reports baseline sciatic pain on both sides pre block   Past Medical History: No date: Anemia No date: Anxiety No date: Asthma No date: Depression No date: Kidney stones No date: Seizures [facility])  Past Surgical History: No date: TONSILLECTOMY No date: WISDOM TOOTH EXTRACTION  BMI    Body Mass Index:  31.93 kg/m      Reproductive/Obstetrics (+) Pregnancy                             Anesthesia Physical Anesthesia Plan  ASA: III  Anesthesia Plan: Epidural   Post-op Pain Management:    Induction:   PONV Risk Score and Plan:   Airway Management Planned:   Additional Equipment:   Intra-op Plan:   Post-operative Plan:   Informed Consent: I have reviewed the patients History and Physical, chart, labs and discussed the procedure including the risks, benefits and alternatives for the proposed anesthesia with the patient or authorized representative who has indicated his/her understanding and acceptance.     Plan Discussed with: Anesthesiologist  Anesthesia Plan Comments:         Anesthesia Quick  Evaluation

## 2017-06-28 NOTE — Progress Notes (Signed)
Erica Gill is a 21 y.o. G5P0040 at [redacted]w[redacted]d Ctx somewhat stronger  Pit 69mu/ min    Objective: BP 122/78   Pulse 67   Temp 97.6 F (36.4 C) (Oral)   Resp 18   Ht  (1.549 m)   Wt 76.7 kg (169 lb)   BMI 31.93 kg/m  No intake/output data recorded. No intake/output data recorded.  FHT:  FHR: 120 bpm, variability: moderate,  accelerations:  Present,  decelerations:  Absent UC:   regular, every 3 minutes SVE:   Dilation: 1.5 Effacement (%): 80 Station: -2 Exam by:: LSE Exam at 6 pm  Labs: Lab Results  Component Value Date   WBC 7.0 06/28/2017   HGB 12.2 06/28/2017   HCT 34.9 (L) 06/28/2017   MCV 88.3 06/28/2017   PLT 182 06/28/2017    Assessment / Plan: Protracted latent phase, srom  Reassuring fetal monitoring  Clear liquids until midnight then NPO  ambien prn  If no cervical change in am  Will proceed with c/s  Ihor Austin Burtis Imhoff 06/28/2017, 8:02 PM

## 2017-06-28 NOTE — Progress Notes (Signed)
Patient ID: Erica Gill, female   DOB: 1996/05/08, 20 y.o.   MRN: [id number] Assuming care  srom at 0400  Currently on pitocin  Ctx not too painful  reassuring fetal monitoring

## 2017-06-28 NOTE — Progress Notes (Signed)
Pt requests epidural, Dr Feliberto Gottron notified of sve and pt request. Order received, pt may have epidral 2225 Dr Orson Ape notified 2232 MD in room 2246 epidural placed 2247 test dose gven Maternal hr 87 2252 bolus #1 given 2256 Bolus #2 given 2256 Drip started

## 2017-06-28 NOTE — Anesthesia Procedure Notes (Addendum)
Epidural Patient location during procedure: OB Start time: 06/28/2017 10:42 PM End time: 06/28/2017 10:47 PM  Staffing Anesthesiologist: Margorie John K Performed: anesthesiologist   Preanesthetic Checklist Completed: patient identified, site marked, surgical consent, pre-op evaluation, timeout performed, IV checked, risks and benefits discussed and monitors and equipment checked  Epidural Patient position: sitting Prep: Betadine Patient monitoring: heart rate, continuous pulse ox and blood pressure Approach: midline Location: L3-L4 Injection technique: LOR saline  Needle:  Needle type: Tuohy  Needle gauge: 17 G Needle length: 9 cm and 9 Needle insertion depth: 5 cm Catheter type: closed end flexible Catheter size: 19 Gauge Catheter at skin depth: 10 cm Test dose: negative and 1.5% lidocaine with Epi 1:200 K  Assessment Sensory level: T10 Events: blood not aspirated, injection not painful, no injection resistance, negative IV test and no paresthesia  Additional Notes 2 attempts Patient reports baseline sciatic pain on both sides pre block Pt. Evaluated and documentation done after procedure finished. Patient identified. Risks/Benefits/Options discussed with patient including but not limited to bleeding, infection, nerve damage, paralysis, failed block, incomplete pain control, headache, blood pressure changes, nausea, vomiting, reactions to medication both or allergic, itching and postpartum back pain. Confirmed with bedside nurse the patient's most recent platelet count. Confirmed with patient that they are not currently taking any anticoagulation, have any bleeding history or any family history of bleeding disorders. Patient expressed understanding and wished to proceed. All questions were answered. Sterile technique was used throughout the entire procedure. Please see nursing notes for vital signs. Test dose was given through epidural catheter and negative prior to  continuing to dose epidural or start infusion. Warning signs of high block given to the patient including shortness of breath, tingling/numbness in hands, complete motor block, or any concerning symptoms with instructions to call for help. Patient was given instructions on fall risk and not to get out of bed. All questions and concerns addressed with instructions to call with any issues or inadequate analgesia.   Patient tolerated the insertion well without immediate complications.Reason for block:procedure for pain

## 2017-06-28 NOTE — Progress Notes (Signed)
Erica Gill is a 21 y.o. G5P0040 at [redacted]w[redacted]d Ctx mild pain . On 20 mu/min Pit  Objective: BP 112/63 (BP Location: Left Arm)   Pulse 71   Temp 98.2 F (36.8 C) (Oral)   Resp 16   Ht  (1.549 m)   Wt 76.7 kg (169 lb)   BMI 31.93 kg/m  No intake/output data recorded. No intake/output data recorded.  FHT:  FHR: 140 bpm, variability: moderate,  accelerations:  Present,  decelerations:  Absent UC:   regular, every 3 minutes SVE:   Dilation: 1 Effacement (%): 70 Station: -1 Exam by:: LSE  exam TJS : 1/ 70 /-2 IUPC placed  Labs: Lab Results  Component Value Date   WBC 7.0 06/28/2017   HGB 12.2 06/28/2017   HCT 34.9 (L) 06/28/2017   MCV 88.3 06/28/2017   PLT 182 06/28/2017    Assessment / Plan: Latent labor   adequate ctx pattern   reassuring monitoring  Cont pitocin  Ihor Austin Abegail Kloeppel 06/28/2017, 3:48 PM

## 2017-06-29 LAB — RPR: RPR: NONREACTIVE

## 2017-06-29 MED ORDER — DIPHENHYDRAMINE HCL 25 MG PO CAPS: 25.0000 mg | ORAL_CAPSULE | Freq: Four times a day (QID) | ORAL | Status: DC | PRN

## 2017-06-29 MED ORDER — MAGNESIUM HYDROXIDE 400 MG/5ML PO SUSP: 30.0000 mL | ORAL | Status: DC | PRN

## 2017-06-29 MED ORDER — BENZOCAINE-MENTHOL 20-0.5 % EX AERO
1.0000 "application " | INHALATION_SPRAY | CUTANEOUS | Status: DC | PRN
  Administered 2017-07-01: 1 via TOPICAL
  Filled 2017-06-29 (×2): qty 56

## 2017-06-29 MED ORDER — ZOLPIDEM TARTRATE 5 MG PO TABS: 5.0000 mg | ORAL_TABLET | Freq: Every evening | ORAL | Status: DC | PRN

## 2017-06-29 MED ORDER — MEASLES, MUMPS & RUBELLA VAC ~~LOC~~ INJ
0.5000 mL | INJECTION | Freq: Once | SUBCUTANEOUS | Status: DC
  Filled 2017-06-29: qty 0.5

## 2017-06-29 MED ORDER — ACETAMINOPHEN 325 MG PO TABS: 650.0000 mg | ORAL_TABLET | ORAL | Status: DC | PRN

## 2017-06-29 MED ORDER — DIBUCAINE 1 % RE OINT: 1.0000 "application " | TOPICAL_OINTMENT | RECTAL | Status: DC | PRN

## 2017-06-29 MED ORDER — SIMETHICONE 80 MG PO CHEW: 80.0000 mg | CHEWABLE_TABLET | ORAL | Status: DC | PRN

## 2017-06-29 MED ORDER — HYDROCORTISONE 2.5 % RE CREA
TOPICAL_CREAM | RECTAL | Status: DC
  Administered 2017-06-30: 22:00:00 via RECTAL
  Filled 2017-06-29 (×3): qty 28.35

## 2017-06-29 MED ORDER — DIBUCAINE 1 % RE OINT
1.0000 "application " | TOPICAL_OINTMENT | RECTAL | Status: DC | PRN
  Administered 2017-06-29: 1 via RECTAL
  Filled 2017-06-29: qty 28

## 2017-06-29 MED ORDER — COCONUT OIL OIL
1.0000 "application " | TOPICAL_OIL | Status: DC | PRN
  Filled 2017-06-29: qty 120

## 2017-06-29 MED ORDER — PRENATAL MULTIVITAMIN CH
1.0000 | ORAL_TABLET | Freq: Every day | ORAL | Status: DC
  Administered 2017-06-29 – 2017-07-01 (×3): 1 via ORAL
  Filled 2017-06-29 (×3): qty 1

## 2017-06-29 MED ORDER — IBUPROFEN 600 MG PO TABS
ORAL_TABLET | ORAL | Status: AC
  Filled 2017-06-29: qty 1

## 2017-06-29 MED ORDER — FERROUS SULFATE 325 (65 FE) MG PO TABS
325.0000 mg | ORAL_TABLET | Freq: Two times a day (BID) | ORAL | Status: DC
  Administered 2017-06-29 – 2017-07-01 (×5): 325 mg via ORAL
  Filled 2017-06-29 (×5): qty 1

## 2017-06-29 MED ORDER — PSYLLIUM 95 % PO PACK
1.0000 | PACK | Freq: Every day | ORAL | Status: DC
  Administered 2017-06-29 – 2017-07-01 (×3): 1 via ORAL
  Filled 2017-06-29 (×3): qty 1

## 2017-06-29 MED ORDER — WITCH HAZEL-GLYCERIN EX PADS: 1.0000 "application " | MEDICATED_PAD | CUTANEOUS | Status: DC | PRN

## 2017-06-29 MED ORDER — OXYCODONE HCL 5 MG PO TABS: 5.0000 mg | ORAL_TABLET | ORAL | Status: DC | PRN

## 2017-06-29 MED ORDER — ONDANSETRON HCL 4 MG PO TABS
4.0000 mg | ORAL_TABLET | ORAL | Status: DC | PRN
  Administered 2017-06-29: 4 mg via ORAL
  Filled 2017-06-29: qty 1

## 2017-06-29 MED ORDER — WITCH HAZEL-GLYCERIN EX PADS
1.0000 "application " | MEDICATED_PAD | CUTANEOUS | Status: DC
  Administered 2017-06-29 – 2017-07-01 (×2): 1 via TOPICAL
  Filled 2017-06-29 (×3): qty 100

## 2017-06-29 MED ORDER — IBUPROFEN 600 MG PO TABS
600.0000 mg | ORAL_TABLET | Freq: Four times a day (QID) | ORAL | Status: DC
  Administered 2017-06-29 – 2017-07-01 (×10): 600 mg via ORAL
  Filled 2017-06-29 (×9): qty 1

## 2017-06-29 MED ORDER — ONDANSETRON HCL 4 MG/2ML IJ SOLN: 4.0000 mg | INTRAMUSCULAR | Status: DC | PRN

## 2017-06-29 MED ORDER — DOCUSATE SODIUM 100 MG PO CAPS
100.0000 mg | ORAL_CAPSULE | Freq: Two times a day (BID) | ORAL | Status: DC
  Administered 2017-06-29 – 2017-07-01 (×4): 100 mg via ORAL
  Filled 2017-06-29 (×4): qty 1

## 2017-06-29 NOTE — Discharge Summary (Signed)
Obstetric Discharge Summary   Patient ID: Patient Name: Erica Gill DOB: Nov 02, 1995 MRN: [id number]  Date of Admission: 06/28/2017 Date of Discharge:07/01/17 Primary OB: [facility]  Gestational Age at Delivery: [redacted]w[redacted]d   Antepartum complications: urolithiasis Admitting Diagnosis:srom   Secondary Diagnoses: Patient Active Problem List   Diagnosis Date Noted  . Pregnancy 06/28/2017  . Indication for care in labor and delivery, antepartum 06/28/2017  . Kidney stones 05/14/2017  . Labor and delivery, indication for care 05/06/2017    Augmentation: Pitocin Complications:4th degree laceration  Intrapartum complications/course:  Protracted latent labor ans prolong ROM  Date of Delivery: 06/29/17 Delivered MV:[id number] Delivery Type: vacuum, outlet Anesthesia: epidural Placenta: sponatneous Laceration: 4th degree laceration  Episiotomy: MLE  Newborn Data: Live born female  Birth Weight: 8 lb (3630 g) APGAR: 8, 9      Postpartum Course  Patient had an uncomplicated postpartum course.  By time of discharge on PPD#2, her pain was controlled on oral pain medications; she had appropriate lochia and was ambulating, voiding without difficulty and tolerating regular diet.  She was deemed stable for discharge to home.     Labs: CBC Latest Ref Rng & Units 06/30/2017 06/28/2017 12/30/2015  WBC 3.6 - 11.0 K/uL 11.4(H) 7.0 7.3  Hemoglobin 12.0 - 16.0 g/dL 1.3(K) 44.0 10.2  Hematocrit 35.0 - 47.0 % 28.1(L) 34.9(L) 39.2  Platelets 150 - 440 K/uL 146(L) 182 251   A POS  Physical exam:  BP (!) 95/55 (BP Location: Right Arm) Comment: nurse Brielle Burnette notified  Pulse 79   Temp 98.1 F (36.7 C) (Oral)   Resp 18   Ht  (1.549 m)   Wt 76.7 kg (169 lb)   SpO2 100%   Breastfeeding? Unknown   BMI 31.93 kg/m  General: alert and no distress Pulm: normal respiratory effort Lochia: appropriate Abdomen: soft, NT Uterine Fundus: firm, below  umbilicus  Extremities: No evidence of DVT seen on physical exam. No lower extremity edema.   Disposition: stable, discharge to home Baby Feeding: breastmilk, pumping  Baby Disposition: home with mom  Contraception: not sure yet  Prenatal Labs:  ABO, Rh:  A pos Antibody:  Neg Rubella:  Immune RPR:   NR HBsAg:   Neg HIV:   Neg GBS:   Neg AFP:1 in 1150 DSR Glucose:114   Plan:  Erica Gill was discharged to home in good condition. Follow-up appointment at [facility] Discharge Instructions: Per After Visit Summary. Activity: Advance as tolerated. Pelvic rest for 6 weeks.  Refer to After Visit Summary Diet: Regular Discharge Medications: Allergies as of 07/01/2017   No Known Allergies     Medication List    TAKE these medications   albuterol 108 (90 Base) MCG/ACT inhaler Commonly known as:  PROVENTIL HFA;VENTOLIN HFA Inhale 2 puffs into the lungs every 6 (six) hours as needed for wheezing or shortness of breath.   benzocaine-Menthol 20-0.5 % Aero Commonly known as:  DERMOPLAST Apply 1 application topically as needed for irritation (perineal discomfort).   docusate sodium 100 MG capsule Commonly known as:  COLACE Take 1 capsule (100 mg total) by mouth 2 (two) times daily.   ferrous sulfate 325 (65 FE) MG tablet Take 325 mg by mouth daily with breakfast.   ibuprofen 600 MG tablet Commonly known as:  ADVIL,MOTRIN Take 1 tablet (600 mg total) by mouth every 6 (six) hours.   oxyCODONE 5 MG immediate release tablet Commonly known as:  Oxy IR/ROXICODONE Take 1 tablet (5 mg  total) by mouth every 4 (four) hours as needed (pain scale 4-7).   prenatal multivitamin Tabs tablet Take 1 tablet by mouth daily at 12 noon.   psyllium 95 % Pack Commonly known as:  HYDROCIL/METAMUCIL Take 1 packet by mouth daily.            Discharge Care Instructions        Start     Ordered   07/01/17 0000  benzocaine-Menthol (DERMOPLAST) 20-0.5 % AERO  As needed      07/01/17 0926   07/01/17 0000  ibuprofen (ADVIL,MOTRIN) 600 MG tablet  Every 6 hours     07/01/17 0926   07/01/17 0000  oxyCODONE (OXY IR/ROXICODONE) 5 MG immediate release tablet  Every 4 hours PRN     07/01/17 0926   07/01/17 0000  psyllium (HYDROCIL/METAMUCIL) 95 % PACK  Daily     07/01/17 0926   07/01/17 0000  docusate sodium (COLACE) 100 MG capsule  2 times daily     07/01/17 0926   07/01/17 0000  Diet - low sodium heart healthy     07/01/17 0926   07/01/17 0000  Call MD for:  extreme fatigue     07/01/17 0926   07/01/17 0000  Call MD for:  persistant dizziness or light-headedness     07/01/17 0926   07/01/17 0000  Call MD for:  hives     07/01/17 0926   07/01/17 0000  Call MD for:  difficulty breathing, headache or visual disturbances     07/01/17 0926   07/01/17 0000  Call MD for:  redness, tenderness, or signs of infection (pain, swelling, redness, odor or green/yellow discharge around incision site)     07/01/17 0926   07/01/17 0000  Call MD for:  severe uncontrolled pain     07/01/17 0926   07/01/17 0000  Call MD for:  persistant nausea and vomiting     07/01/17 0926   07/01/17 0000  Call MD for:  temperature >100.4     07/01/17 0926   07/01/17 0000  Call MD for:    Comments:  Rectal pain / bleeding   07/01/17 0926   06/28/17 0000  OB RESULT CONSOLE Group B Strep    Comments:  This external order was created through the Results Console.   06/28/17 0938   06/28/17 0000  OB RESULTS CONSOLE GC/Chlamydia    Comments:  This external order was created through the Results Console.   06/28/17 0938   06/28/17 0000  OB RESULTS CONSOLE RPR    Comments:  This external order was created through the Results Console.    06/28/17 0938   06/28/17 0000  OB RESULTS CONSOLE RPR    Comments:  This external order was created through the Results Console.    06/28/17 0938   06/28/17 0000  OB RESULTS CONSOLE HIV antibody    Comments:  This external order was created through the  Results Console.    06/28/17 0938   06/28/17 0000  OB RESULTS CONSOLE Rubella Antibody    Comments:  This external order was created through the Results Console.    06/28/17 0938   06/28/17 0000  OB RESULTS CONSOLE Varicella zoster antibody, IgG    Comments:  This external order was created through the Results Console.    06/28/17 0938   06/28/17 0000  OB RESULTS CONSOLE Hepatitis B surface antigen    Comments:  This external order was created through the Results Console.  06/28/17 0938   06/28/17 0000  OB RESULTS CONSOLE ABO/Rh    Comments:  This external order was created through the Results Console.    06/28/17 0938   06/28/17 0000  OB RESULTS CONSOLE Antibody Screen    Comments:  This external order was created through the Results Console.    06/28/17 1610     Outpatient follow up:  Follow-up Information    Jsiah Menta, Ihor Austin, MD Follow up in 2 week(s).   Specialty:  Obstetrics and Gynecology Why:  post op 4th lac check  Contact information: [address] [place] [address] [phone]            Signed:  Ihor Austin Juquan Reznick

## 2017-06-29 NOTE — Op Note (Signed)
NAMESIDONIE, DEXHEIMER NO.:  [id number]  MEDICAL RECORD NO.:  [id number]  LOCATION:                                 FACILITY:  PHYSICIAN:  Jennell Corner, MDDATE OF BIRTH:  [birth year]  DATE OF PROCEDURE:  06/29/2017 DATE OF DISCHARGE:                              OPERATIVE REPORT   PREOPERATIVE DIAGNOSIS:  Maternal exhaustion requiring Kiwi vacuum delivery.  POSTOPERATIVE DIAGNOSIS:  4th degree laceration incorporating total anal sphincter muscle.  PROCEDURE PERFORMED: 1. Kiwi vacuum outlet delivery. 2. Repair of 4th degree laceration.  SURGEON:  Jennell Corner, MD  ANESTHESIA:  Continuous lumbar epidural.  INDICATION:  This is a 21 year old gravida 1, para 0, who labored for 24 hours and brought the baby down to +3/3 station and was not able to push the baby through due to maternal exhaustion.  DESCRIPTION OF PROCEDURE:  A small midline episiotomy was performed and the Kiwi vacuum was applied to the fetal occiput after discussing this with the patient and the father of the baby.  They were in agreement that this was okay.  One pull allowed for delivery of an elongated large head.  Vacuum was removed.  Shoulders were then delivered without difficulty followed by the body.  Vigorous female was then placed on the patient's abdomen and dried for 60 seconds before the cord was doubly clamped and cut.  Apgar's 8 and 9.  Fetal weight 8 pounds 0 ounces. Placenta delivered shortly thereafter, and a 3-vessel cord was noted. 4th degree laceration with approximately 3 cm of rectal mucosa affected. Rectal mucosa was closed with a 3-0 Vicryl suture running with 2 separate suture planes.  Rectal finger was used to aid in the closure. Good closure of the defect with no residual openings noted.  Total transection of the anal sphincter, internal and external, was noted. The sphincter was incorporated back centrally with three 2-0 Vicryl figure-of-eight  sutures with good approximation of the muscle.  Repeat rectal exam demonstrated good tone.  Vaginal mucosa was then closed in a standard fashion followed by a crown suture and subcuticular suture with good cosmetic effect and repeat rectal exam at the end of the case demonstrated normal anatomy.  Estimated blood loss 350 mL.  The patient tolerated the procedure well.          ______________________________ Jennell Corner, MD    TS/MEDQ  D:  06/29/2017  T:  06/29/2017  Job:  [id number]
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
## 2017-06-30 LAB — CBC
HCT: 28.1 % — ABNORMAL LOW (ref 35.0–47.0)
HEMOGLOBIN: 9.7 g/dL — AB (ref 12.0–16.0)
MCH: 30.8 pg (ref 26.0–34.0)
MCHC: 34.5 g/dL (ref 32.0–36.0)
MCV: 89.4 fL (ref 80.0–100.0)
PLATELETS: 146 10*3/uL — AB (ref 150–440)
RBC: 3.14 MIL/uL — AB (ref 3.80–5.20)
RDW: 15.3 % — ABNORMAL HIGH (ref 11.5–14.5)
WBC: 11.4 10*3/uL — ABNORMAL HIGH (ref 3.6–11.0)

## 2017-06-30 NOTE — Anesthesia Postprocedure Evaluation (Signed)
Anesthesia Post Note  Patient: Erica Gill  Procedure(s) Performed: * No procedures listed *  Patient location during evaluation: [facility] Anesthesia Type: Epidural Level of consciousness: awake, awake and alert and oriented Pain management: pain level controlled Vital Signs Assessment: post-procedure vital signs reviewed and stable Respiratory status: spontaneous breathing Cardiovascular status: blood pressure returned to baseline Postop Assessment: no headache, no backache, no apparent nausea or vomiting and adequate PO intake Anesthetic complications: no     Last Vitals:  Vitals:   06/29/17 2343 06/30/17 0211  BP: (!) 99/48 104/60  Pulse: 81 85  Resp: 16 17  Temp: 36.8 C 36.9 C  SpO2: 99% 99%    Last Pain:  Vitals:   06/30/17 0211  TempSrc: Oral  PainSc:                  Karoline Caldwell

## 2017-06-30 NOTE — Progress Notes (Signed)
Post Partum Day 1 Subjective: bottom is sore  Objective: Blood pressure 110/63, pulse 88, temperature 97.9 F (36.6 C), temperature source Oral, resp. rate 17, height  (1.549 m), weight 76.7 kg (169 lb), SpO2 100 %, unknown if currently breastfeeding.  Physical Exam:  General: alert and cooperative Lochia: appropriate Uterine Fundus: firm Incision: n/a DVT Evaluation: No evidence of DVT seen on physical exam.   Recent Labs  06/28/17 0755 06/30/17 0645  HGB 12.2 9.7*  HCT 34.9* 28.1*    Assessment/Plan: Plan for discharge tomorrow Cont colace , metamucil,   LOS: 2 days   Ihor Austin Schermerhorn 06/30/2017, 12:00 PM
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
## 2017-06-30 NOTE — Lactation Note (Signed)
This note was copied from a baby's chart. Lactation Consultation Note  Patient Name: Girl Shene Maxfield ZOXWR'U Date: 06/30/2017 Reason for consult: Follow-up assessment  Mom has been attempting to breastfeed Harmony all day. She will oftentimes latch, but not suckle today unless a little formula is inserted into the nipple shield. The 24 NS is working best for both nipples now. Both are healing now that Mom has been wearing breast shells. I am concerned about continued poor breast stimulation and poor feeds lasting this long, so I discussed some options with Mom and agree to have her pump an dhand express 15 minutes any time baby does not nurse that long. Plan written in room.   Maternal Data    Feeding Feeding Type: Breast Fed Length of feed: 5 min (falls asleep)  LATCH Score Latch: Grasps breast easily, tongue down, lips flanged, rhythmical sucking. (with nipple shield only)  Audible Swallowing: A few with stimulation (need to compress breast or drops of formula in NS)  Type of Nipple: Flat  Comfort (Breast/Nipple): Filling, red/small blisters or bruises, mild/mod discomfort (bruises starting to heal)  Hold (Positioning): No assistance needed to correctly position infant at breast.  LATCH Score: 7  Interventions Interventions: Assisted with latch;Breast massage;Expressed milk (formula in NS)  Lactation Tools Discussed/Used Tools: Nipple Jeriah Corkum Nipple shield size: 24   Consult Status Consult Status: Follow-up    Sunday Corn 06/30/2017, 4:19 PM

## 2017-07-01 MED ORDER — IBUPROFEN 600 MG PO TABS: 600.0000 mg | ORAL_TABLET | Freq: Four times a day (QID) | ORAL | 0 refills | Status: DC

## 2017-07-01 MED ORDER — PSYLLIUM 95 % PO PACK: 1.0000 | PACK | Freq: Every day | ORAL | 1 refills | Status: DC

## 2017-07-01 MED ORDER — BENZOCAINE-MENTHOL 20-0.5 % EX AERO: 1.0000 "application " | INHALATION_SPRAY | CUTANEOUS | 1 refills | Status: DC | PRN

## 2017-07-01 MED ORDER — DOCUSATE SODIUM 100 MG PO CAPS: 100.0000 mg | ORAL_CAPSULE | Freq: Two times a day (BID) | ORAL | 0 refills | Status: DC

## 2017-07-01 MED ORDER — OXYCODONE HCL 5 MG PO TABS: 5.0000 mg | ORAL_TABLET | ORAL | 0 refills | Status: DC | PRN

## 2017-07-01 NOTE — Lactation Note (Signed)
This note was copied from a baby's chart. Lactation Consultation Note  Patient Name: Erica Gill ZOXWR'U Date: 07/01/2017 Reason for consult: Follow-up assessment   Maternal Data Has patient been taught Hand Expression?: Yes  Feeding Feeding Type: Breast Fed Nipple Type: Slow - flow Length of feed: 10 min  LATCH Score Latch: Grasps breast easily, tongue down, lips flanged, rhythmical sucking.  Audible Swallowing: Spontaneous and intermittent  Type of Nipple: Flat  Comfort (Breast/Nipple): Soft / non-tender  Hold (Positioning): Assistance needed to correctly position infant at breast and maintain latch.  LATCH Score: 8  Interventions Interventions: Assisted with latch;Breast feeding basics reviewed  Lactation Tools Discussed/Used Tools: Shells;Nipple Dorris Carnes  DVD on breastfeeding shown Consult Status Consult Status: Follow-up Follow-up type: Out-patient    Gilman Schmidt Gregoire Bennis 07/01/2017, 12:30 PM

## 2017-08-13 IMAGING — US US OB COMP LESS 14 WK
1 series · 14 of 28 positions shown · non-contrast
Comparison: None.

CLINICAL DATA: Left lower quadrant pain

EXAM:
OBSTETRIC <14 WK US AND TRANSVAGINAL OB US
TECHNIQUE: Both transabdominal and transvaginal ultrasound examinations were
performed for complete evaluation of the gestation as well as the
maternal uterus, adnexal regions, and pelvic cul-de-sac.
Transvaginal technique was performed to assess early pregnancy.

[Series 1: us ob comp less 14 wk · 0.15mm/px · 14 of 112 slices shown]
[im 5/112]
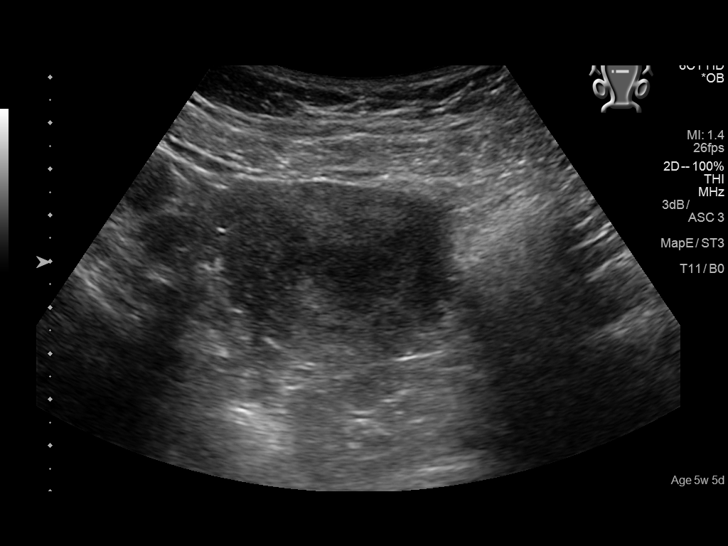
[im 13/112]
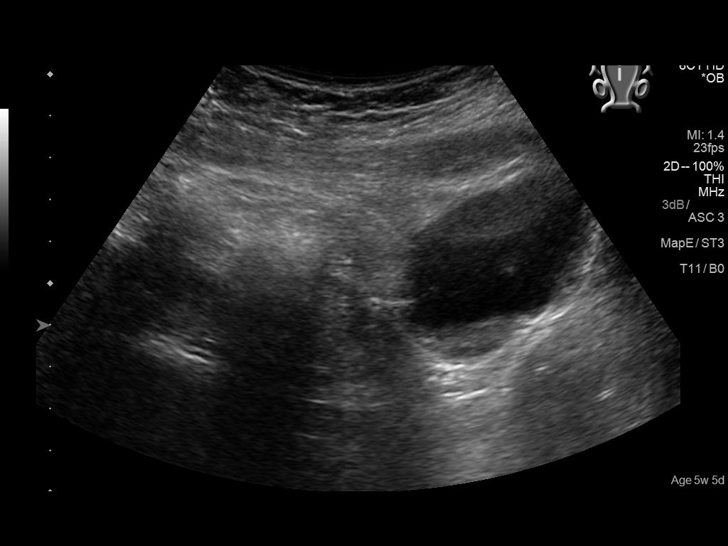
[im 21/112]
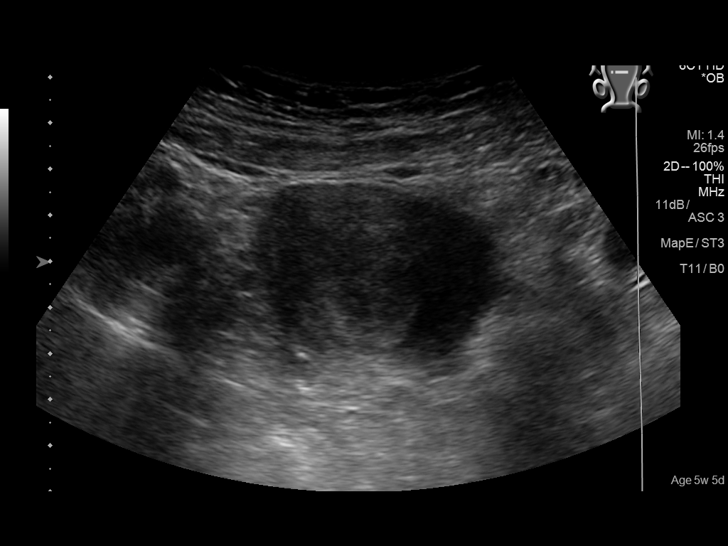
[im 29/112]
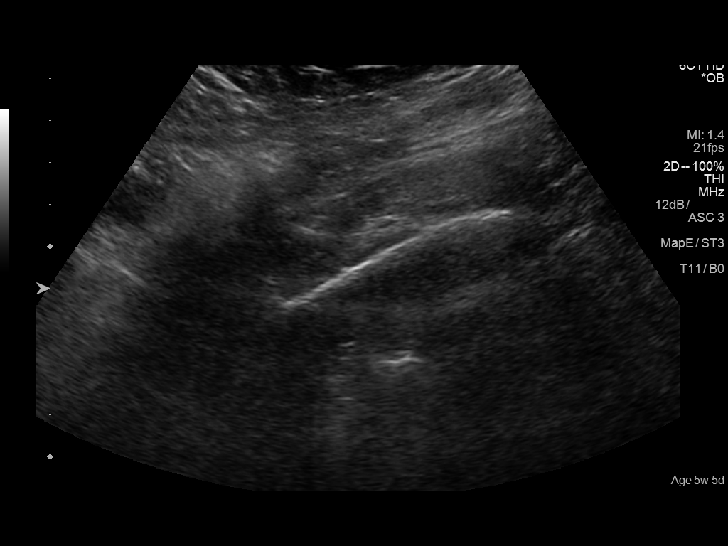
[im 38/112]
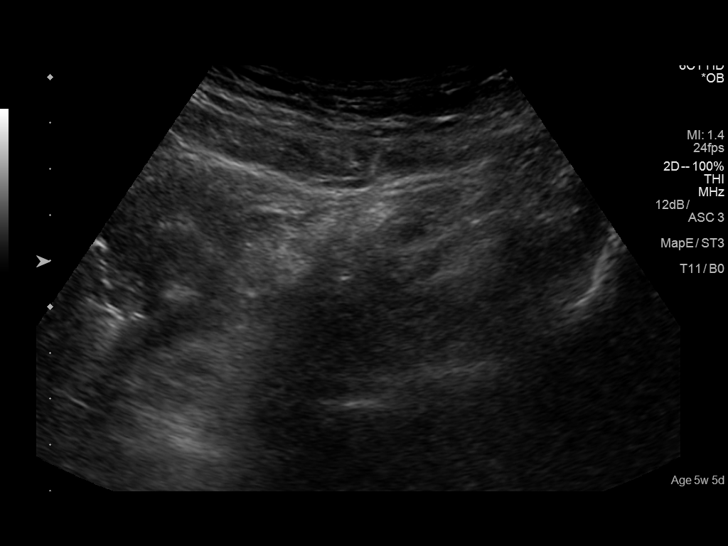
[im 46/112]
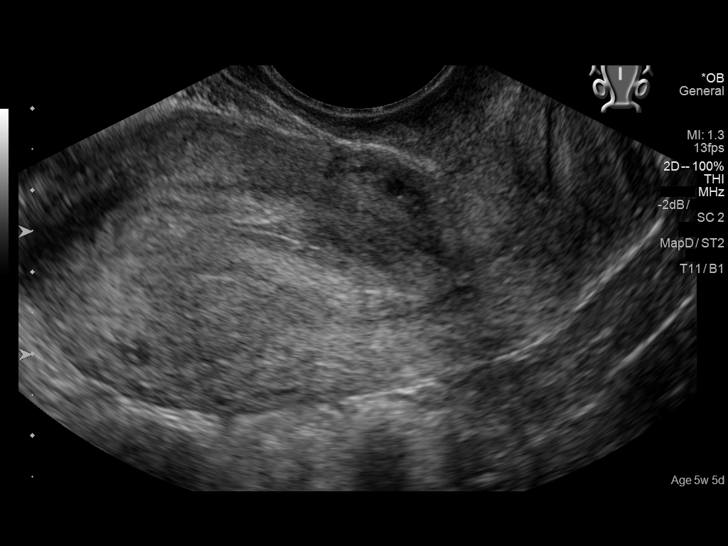
[im 54/112]
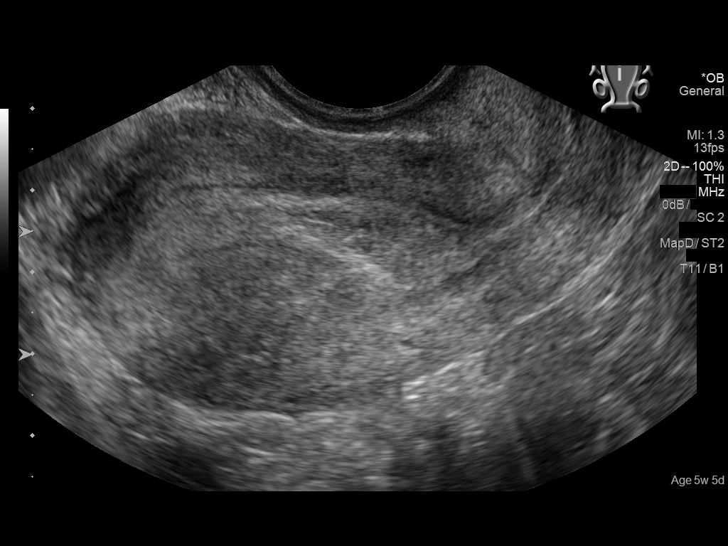
[im 62/112]
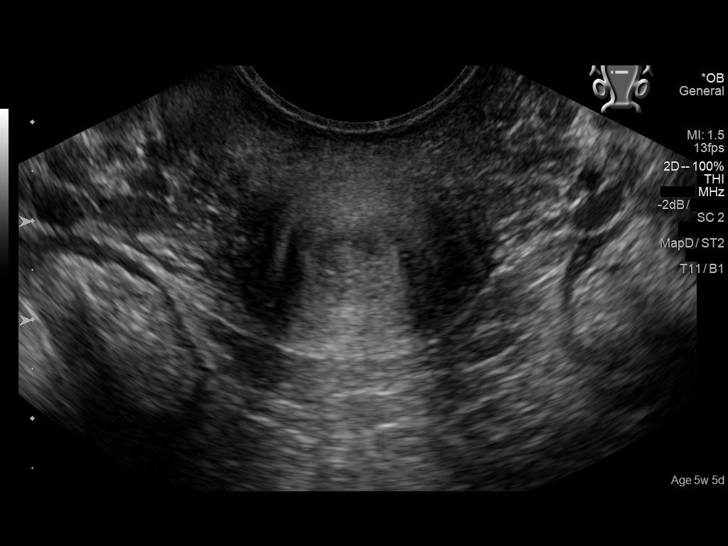
[im 70/112]
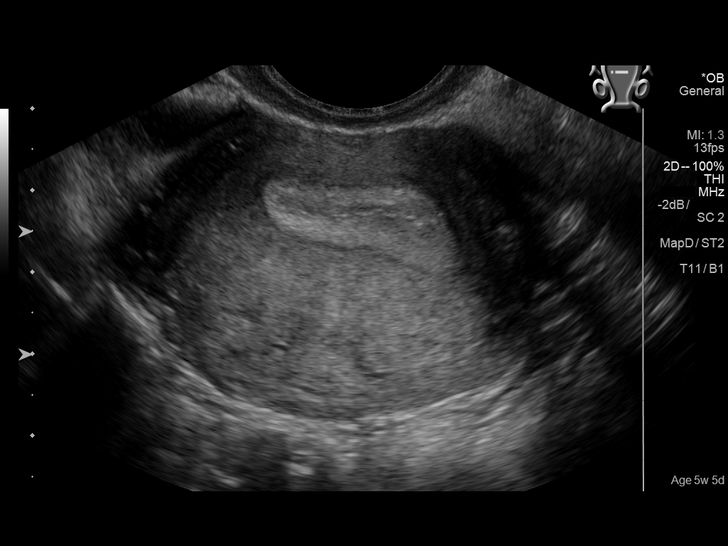
[im 79/112]
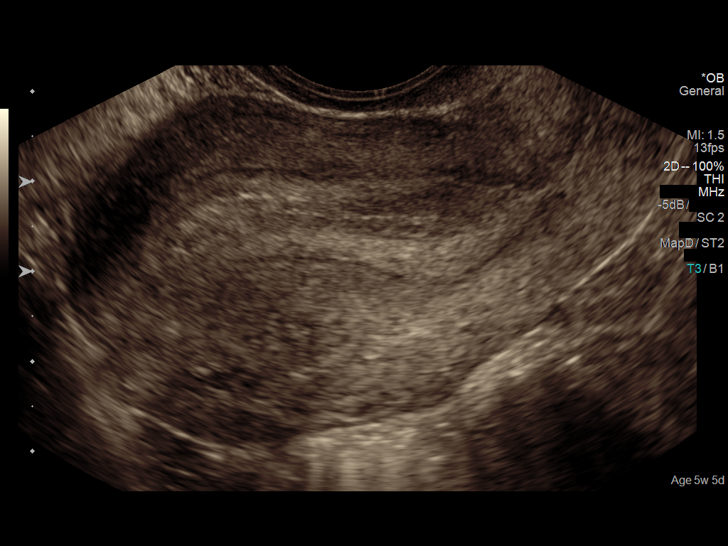
[im 87/112]
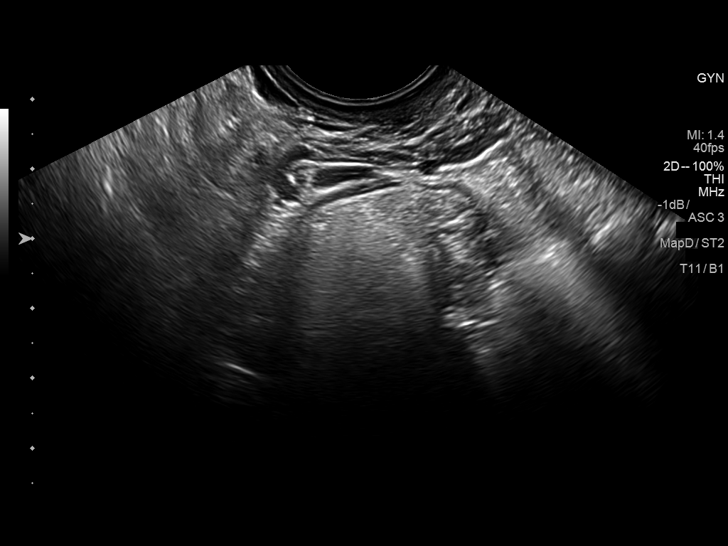
[im 95/112]
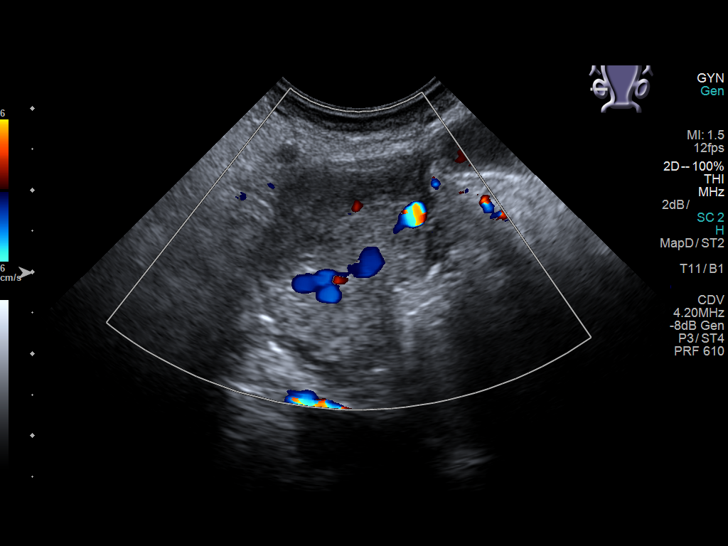
[im 103/112]
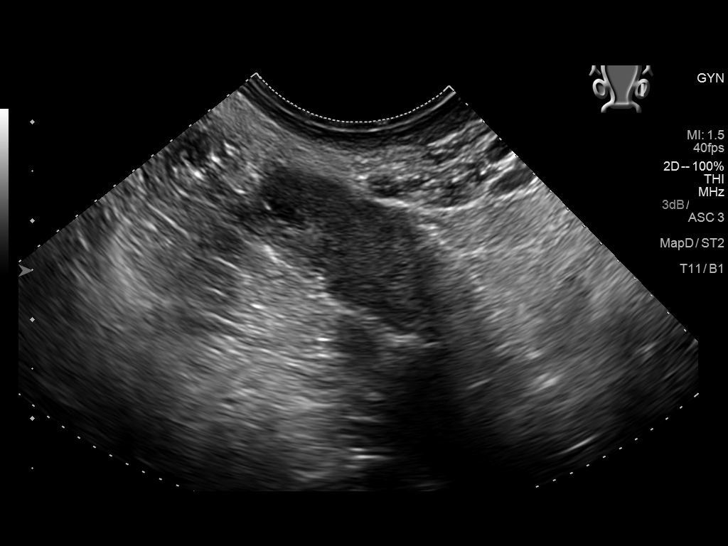
[im 112/112]
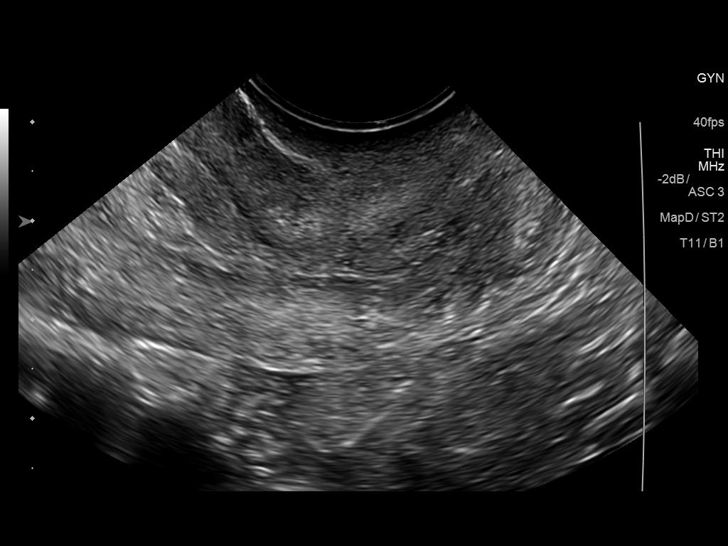

[14 of 28 positions shown; findings below may reference images not displayed]

FINDINGS: Intrauterine gestational sac: Not visualized

Yolk sac:  Not visualized

Embryo:  Not visualized

Cardiac Activity: Not visualized

Maternal uterus/adnexae: Normal bilateral ovaries. No adnexal mass.
Normal endometrial thickness measuring 11 mm. Small amount of pelvic
free fluid.
IMPRESSION: No intrauterine pregnancy identified. Differential diagnosis
includes pregnancy too early to detect versus missed abortion versus
ectopic pregnancy. Recommend clinical correlation, serial
quantitative beta HCGs, ectopic precautions and followup ultrasound
as clinically indicated.

## 2018-01-27 ENCOUNTER — Emergency Department
Admission: EM | Admit: 2018-01-27 | Discharge: 2018-01-27 | Disposition: A | Discharge status: Home / Self Care | Payer: Medicaid Other | Attending: Emergency Medicine | Admitting: Emergency Medicine

## 2018-01-27 ENCOUNTER — Emergency Department: Payer: Medicaid Other

## 2018-01-27 ENCOUNTER — Encounter: Payer: Self-pay | Admitting: Emergency Medicine

## 2018-01-27 DIAGNOSIS — Z79899 Other long term (current) drug therapy: Secondary | ICD-10-CM | POA: Insufficient documentation

## 2018-01-27 DIAGNOSIS — Z3202 Encounter for pregnancy test, result negative: Secondary | ICD-10-CM | POA: Insufficient documentation

## 2018-01-27 DIAGNOSIS — J45909 Unspecified asthma, uncomplicated: Secondary | ICD-10-CM | POA: Insufficient documentation

## 2018-01-27 DIAGNOSIS — N12 Tubulo-interstitial nephritis, not specified as acute or chronic: Secondary | ICD-10-CM | POA: Insufficient documentation

## 2018-01-27 DIAGNOSIS — F17210 Nicotine dependence, cigarettes, uncomplicated: Secondary | ICD-10-CM | POA: Insufficient documentation

## 2018-01-27 LAB — URINALYSIS, COMPLETE (UACMP) WITH MICROSCOPIC
Bilirubin Urine: NEGATIVE
Glucose, UA: NEGATIVE mg/dL
Ketones, ur: 80 mg/dL — AB
Nitrite: NEGATIVE
Protein, ur: 100 mg/dL — AB
SPECIFIC GRAVITY, URINE: 1.013 (ref 1.005–1.030)
WBC, UA: >50 WBC/hpf — ABNORMAL HIGH (ref 0–5)
pH: 5 (ref 5.0–8.0)

## 2018-01-27 LAB — BASIC METABOLIC PANEL
ANION GAP: 9 (ref 5–15)
BUN: 10 mg/dL (ref 6–20)
CHLORIDE: 103 mmol/L (ref 101–111)
CO2: 22 mmol/L (ref 22–32)
Calcium: 8.3 mg/dL — ABNORMAL LOW (ref 8.9–10.3)
Creatinine, Ser: 0.54 mg/dL (ref 0.44–1.00)
Glucose, Bld: 104 mg/dL — ABNORMAL HIGH (ref 65–99)
POTASSIUM: 3.5 mmol/L (ref 3.5–5.1)
SODIUM: 134 mmol/L — AB (ref 135–145)

## 2018-01-27 LAB — CBC
HEMATOCRIT: 35.7 % (ref 35.0–47.0)
Hemoglobin: 12.1 g/dL (ref 12.0–16.0)
MCH: 28.2 pg (ref 26.0–34.0)
MCHC: 33.9 g/dL (ref 32.0–36.0)
MCV: 83.3 fL (ref 80.0–100.0)
Platelets: 220 10*3/uL (ref 150–440)
RBC: 4.28 MIL/uL (ref 3.80–5.20)
RDW: 14.6 % — ABNORMAL HIGH (ref 11.5–14.5)
WBC: 11 10*3/uL (ref 3.6–11.0)

## 2018-01-27 LAB — POCT PREGNANCY, URINE: Preg Test, Ur: NEGATIVE

## 2018-01-27 MED ORDER — ONDANSETRON HCL 4 MG PO TABS: 4.0000 mg | ORAL_TABLET | Freq: Three times a day (TID) | ORAL | 0 refills | Status: DC | PRN

## 2018-01-27 MED ORDER — SODIUM CHLORIDE 0.9 % IV BOLUS
1000.0000 mL | Freq: Once | INTRAVENOUS | Status: AC
  Administered 2018-01-27: 1000 mL via INTRAVENOUS

## 2018-01-27 MED ORDER — OXYCODONE-ACETAMINOPHEN 5-325 MG PO TABS: 1.0000 | ORAL_TABLET | ORAL | 0 refills | Status: DC | PRN

## 2018-01-27 MED ORDER — SODIUM CHLORIDE 0.9 % IV SOLN
1.0000 g | Freq: Once | INTRAVENOUS | Status: AC
  Administered 2018-01-27: 1 g via INTRAVENOUS
  Filled 2018-01-27: qty 10

## 2018-01-27 MED ORDER — CEPHALEXIN 500 MG PO CAPS: 500.0000 mg | ORAL_CAPSULE | Freq: Three times a day (TID) | ORAL | 0 refills | Status: AC

## 2018-01-27 MED ORDER — KETOROLAC TROMETHAMINE 30 MG/ML IJ SOLN
15.0000 mg | Freq: Once | INTRAMUSCULAR | Status: AC
  Administered 2018-01-27: 15 mg via INTRAVENOUS
  Filled 2018-01-27: qty 1

## 2018-01-27 NOTE — ED Notes (Signed)
Patient transported to CT 

## 2018-01-27 NOTE — Discharge Instructions (Signed)
Prefer to go home, if you have increased pain, high fever, vomiting, or you feel worse in any way return to the emergency department follow closely with primary care.

## 2018-01-27 NOTE — ED Notes (Signed)
Lab notified to add on Urine Culture. 

## 2018-01-27 NOTE — ED Provider Notes (Addendum)
[facility] Emergency Department Provider Note  ____________________________________________   I have reviewed the triage vital signs and the nursing notes. Where available I have reviewed prior notes and, if possible and indicated, outside hospital notes.    HISTORY  Chief Complaint Flank Pain    HPI Erica Gill is a 22 y.o. female with a history of recurrent kidney stones, anemia, depression, presents today complaining of right-sided flank pain, she actually started with dysuria 1 week ago, gradually developed right-sided flank pain 1 week ago, has had some vomiting, over the last few days.  Also over the last few days has had tactile fevers.  Did not take her temperature at home.  Went to [facility] where she is found to be febrile.  Received Tylenol feels somewhat better.  She states that the pain feels similar to kidney stones and she waited 2 days to see thought it would go away.  Not usually get fevers with kidney stones.  Is in the right flank, sometimes radiates towards her groin, better with Tylenol, nothing else made it worse, no other alleviating or remitting factors no other concerns   Past Medical History:  Diagnosis Date  . Anemia   . Anxiety   . Asthma   . Depression   . Kidney stones   . Seizures [facility])     Patient Active Problem List   Diagnosis Date Noted  . Pregnancy 06/28/2017  . Indication for care in labor and delivery, antepartum 06/28/2017  . Kidney stones 05/14/2017  . Labor and delivery, indication for care 05/06/2017    Past Surgical History:  Procedure Laterality Date  . TONSILLECTOMY    . WISDOM TOOTH EXTRACTION      Prior to Admission medications   Medication Sig Start Date End Date Taking? Authorizing Provider  albuterol (PROVENTIL HFA;VENTOLIN HFA) 108 (90 Base) MCG/ACT inhaler Inhale 2 puffs into the lungs every 6 (six) hours as needed for wheezing or shortness of breath. 04/04/17   Enid Derry, PA-C   benzocaine-Menthol (DERMOPLAST) 20-0.5 % AERO Apply 1 application topically as needed for irritation (perineal discomfort). 07/01/17   Schermerhorn, Ihor Austin, MD  docusate sodium (COLACE) 100 MG capsule Take 1 capsule (100 mg total) by mouth 2 (two) times daily. 07/01/17   Schermerhorn, Ihor Austin, MD  ferrous sulfate 325 (65 FE) MG tablet Take 325 mg by mouth daily with breakfast.    [provider]  ibuprofen (ADVIL,MOTRIN) 600 MG tablet Take 1 tablet (600 mg total) by mouth every 6 (six) hours. 07/01/17   Schermerhorn, Ihor Austin, MD  oxyCODONE (OXY IR/ROXICODONE) 5 MG immediate release tablet Take 1 tablet (5 mg total) by mouth every 4 (four) hours as needed (pain scale 4-7). 07/01/17   Schermerhorn, Ihor Austin, MD  Prenatal Vit-Fe Fumarate-FA (PRENATAL MULTIVITAMIN) TABS tablet Take 1 tablet by mouth daily at 12 noon.    [provider]  psyllium (HYDROCIL/METAMUCIL) 95 % PACK Take 1 packet by mouth daily. 07/01/17   Schermerhorn, Ihor Austin, MD    Allergies Patient has no known allergies.  Family History  Problem Relation Age of Onset  . Thrombosis Mother     Social History Social History   Tobacco Use  . Smoking status: Current Every Day Smoker    Packs/day: 0.50    Types: Cigarettes  . Smokeless tobacco: Never Used  Substance Use Topics  . Alcohol use: No  . Drug use: No    Review of Systems Constitutional: No fever/chills Eyes: No visual  changes. ENT: No sore throat. No stiff neck no neck pain Cardiovascular: Denies chest pain. Respiratory: Denies shortness of breath. Gastrointestinal:   + vomiting.  No diarrhea.  No constipation. Genitourinary: + for dysuria. Musculoskeletal: Negative lower extremity swelling Skin: Negative for rash. Neurological: Negative for severe headaches, focal weakness or numbness.   ____________________________________________   PHYSICAL EXAM:  VITAL SIGNS: ED Triage Vitals  Enc Vitals Group     BP 01/27/18 1242 101/62      Pulse Rate 01/27/18 1242 (!) 123     Resp 01/27/18 1242 16     Temp 01/27/18 1242 (!) 100.5 F (38.1 C)     Temp Source 01/27/18 1242 Oral     SpO2 01/27/18 1242 98 %     Weight 01/27/18 1233 165 lb (74.8 kg)     Height 01/27/18 1233 5\' 1"  (1.549 m)     Head Circumference --      Peak Flow --      Pain Score 01/27/18 1233 7     Pain Loc --      Pain Edu? --      Excl. in GC? --     Constitutional: Alert and oriented. Well appearing and in no acute distress. Eyes: Conjunctivae are normal Head: Atraumatic HEENT: No congestion/rhinnorhea. Mucous membranes are moist.  Oropharynx non-erythematous Neck:   Nontender with no meningismus, no masses, no stridor Cardiovascular: Mild tachycardia regular rhythm. Grossly normal heart sounds.  Good peripheral circulation. Respiratory: Normal respiratory effort.  No retractions. Lungs CTAB. Abdominal: Soft and nontender. No distention. No guarding no rebound Back:  There is no focal tenderness or step off.  there is no midline tenderness there are no lesions noted. there is positive right CVA tenderness Musculoskeletal: No lower extremity tenderness, no upper extremity tenderness. No joint effusions, no DVT signs strong distal pulses no edema Neurologic:  Normal speech and language. No gross focal neurologic deficits are appreciated.  Skin:  Skin is warm, dry and intact. No rash noted. Psychiatric: Mood and affect are normal. Speech and behavior are normal.  ____________________________________________   LABS (all labs ordered are listed, but only abnormal results are displayed)  Labs Reviewed  URINALYSIS, COMPLETE (UACMP) WITH MICROSCOPIC - Abnormal; Notable for the following components:      Result Value   Color, Urine YELLOW (*)    APPearance CLOUDY (*)    Hgb urine dipstick SMALL (*)    Ketones, ur 80 (*)    Protein, ur 100 (*)    Leukocytes, UA LARGE (*)    WBC, UA >50 (*)    Bacteria, UA MANY (*)    All other components within  normal limits  BASIC METABOLIC PANEL - Abnormal; Notable for the following components:   Sodium 134 (*)    Glucose, Bld 104 (*)    Calcium 8.3 (*)    All other components within normal limits  CBC - Abnormal; Notable for the following components:   RDW 14.6 (*)    All other components within normal limits  URINE CULTURE  POC URINE PREG, ED  POCT PREGNANCY, URINE    Pertinent labs  results that were available during my care of the patient were reviewed by me and considered in my medical decision making (see chart for details). ____________________________________________  EKG  I personally interpreted any EKGs ordered by me or triage  ____________________________________________  RADIOLOGY  Pertinent labs & imaging results that were available during my care of the patient were reviewed by me  and considered in my medical decision making (see chart for details). If possible, patient and/or family made aware of any abnormal findings.  No results found. ____________________________________________    PROCEDURES  Procedure(s) performed: None  Procedures  Critical Care performed: None  ____________________________________________   INITIAL IMPRESSION / ASSESSMENT AND PLAN / ED COURSE  Pertinent labs & imaging results that were available during my care of the patient were reviewed by me and considered in my medical decision making (see chart for details).  Patient here with at least pyelonephritis given her work-up concern will be pyelonephritis with kidney stone.  We are giving IV fluids, we will empirically start broad-spectrum antibiotics, there is no guidance from cultures seen in prior visits, we will obtain CT scan to rule out infected kidney stone and we will continue to observe closely here.  Patient actually looks better than expected  ----------------------------------------- 3:25 PM on 01/27/2018 -----------------------------------------  Patient continues in  no acute distress, she does have no evidence of kidney stone fortunately on CT scan, we will see how she feels after antibiotics and IV fluids.  She is not complaining of significant pain and appears quite comfortable.  Is my hope that she may actually be able to go home  ----------------------------------------- 4:23 PM on 01/27/2018 -----------------------------------------  Patient feels much better, vital signs reassuring, we will hopefully be able to get her safely home.  She is in no acute distress.  We will give her a work note.  She is tolerating p.o. here.  She would prefer to go home rather than be admitted.  Return precautions were given and understood.    ____________________________________________   FINAL CLINICAL IMPRESSION(S) / ED DIAGNOSES  Final diagnoses:  None      This chart was dictated using voice recognition software.  Despite best efforts to proofread,  errors can occur which can change meaning.      Jeanmarie PlantMcShane, Donye Campanelli A, MD 01/27/18 1436    Jeanmarie PlantMcShane, Wyatt Thorstenson A, MD 01/27/18 1526    Jeanmarie PlantMcShane, Treavon Castilleja A, MD 01/27/18 [phone]

## 2018-01-27 NOTE — ED Triage Notes (Signed)
Pt comes into the ED via POV c/o right flank pain that started a couple of days ago.  Patient was seen at [facility] with a fever of 102 and was given tylenol.  Patient states she is feeling a little better with that.  Patient has h/o kidney stones and this feels similar.  Patient has nausea associated with the pain.  Patient in NAD at this time with even and unlabored respirations.

## 2018-01-29 LAB — URINE CULTURE

## 2018-06-11 IMAGING — CT CT RENAL STONE PROTOCOL
3 of 4 series · 8 of 46 positions shown, 15 images · non-contrast
Comparison: None.

CLINICAL DATA: 21 y/o  F; right-sided flank pain.

EXAM:
CT ABDOMEN AND PELVIS WITHOUT CONTRAST
TECHNIQUE: Multidetector CT imaging of the abdomen and pelvis was performed
following the standard protocol without IV contrast.

[Series 4: lung bases · axial · 0.68mm/px · z∈[-562,-502]mm · 4 of 20 slices shown, 9 images]
[im 4/20  soft-tissue]
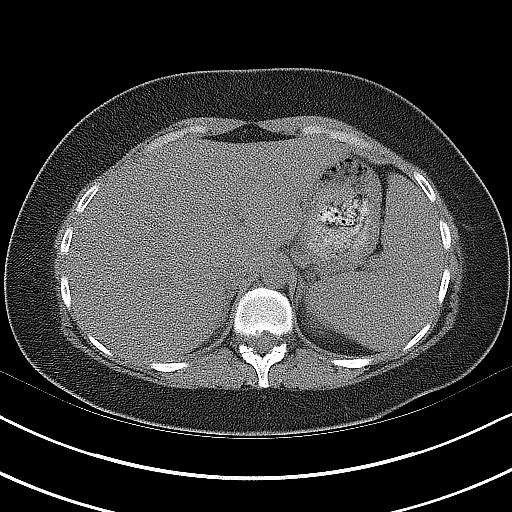
[im 4/20  lung]
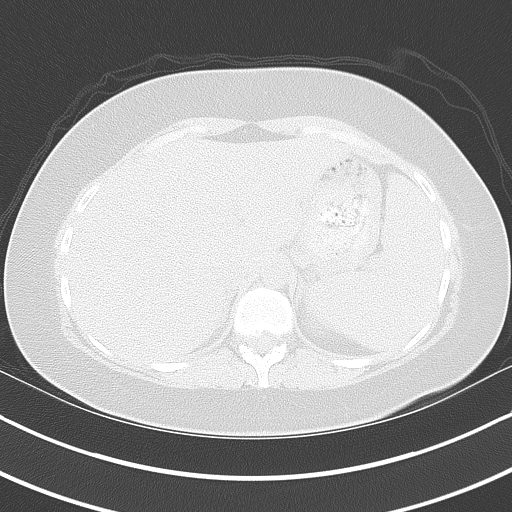
[im 4/20  bone]
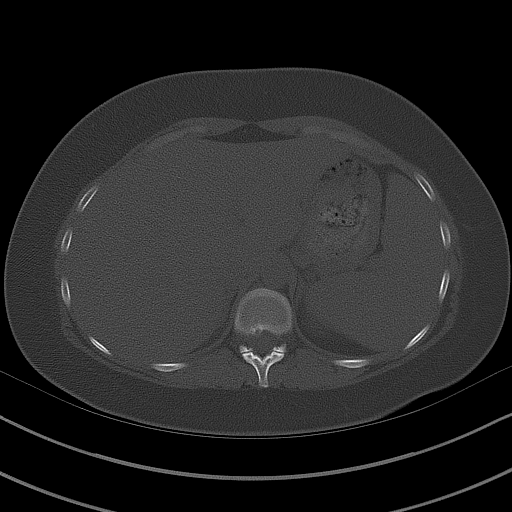
[im 8/20  soft-tissue]
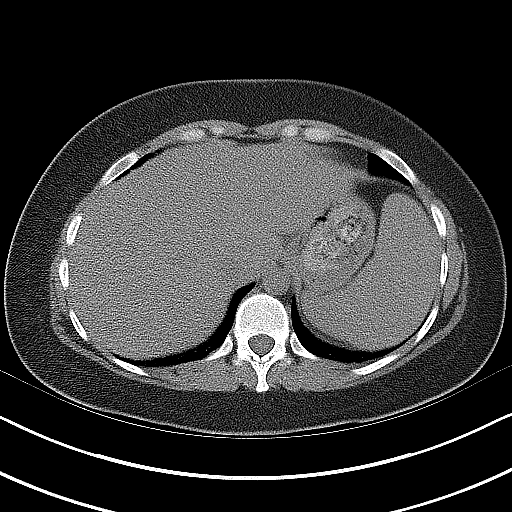
[im 8/20  lung]
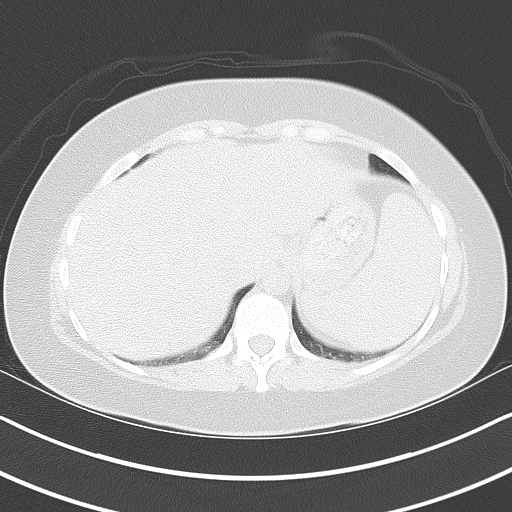
[im 12/20  soft-tissue]
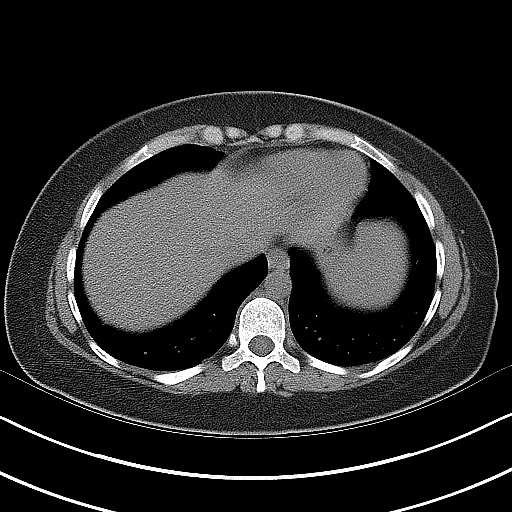
[im 12/20  lung]
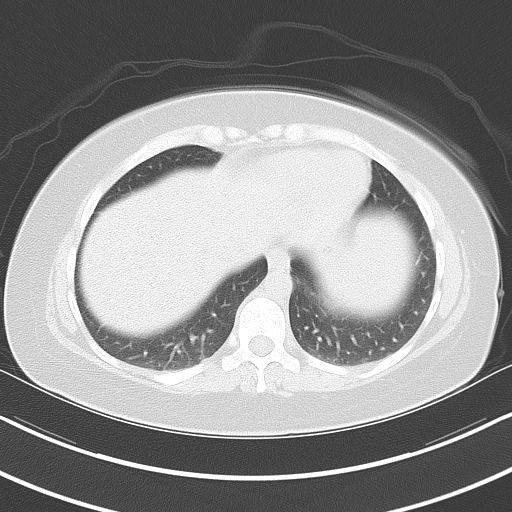
[im 16/20  soft-tissue]
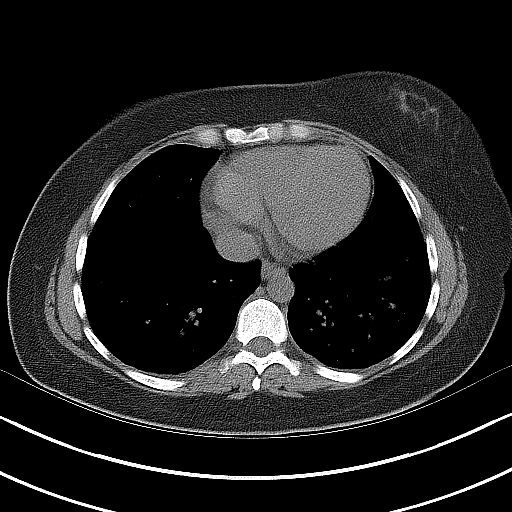
[im 16/20  lung]
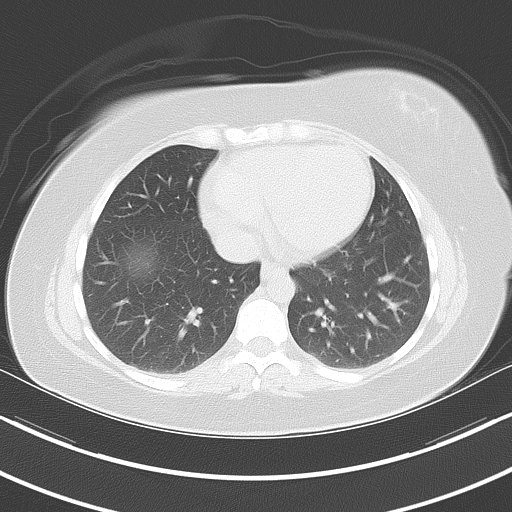

[Series 5: coronal · coronal · 0.63mm/px · 3 of 100 slices shown, 4 images]
[im 34/100  soft-tissue]
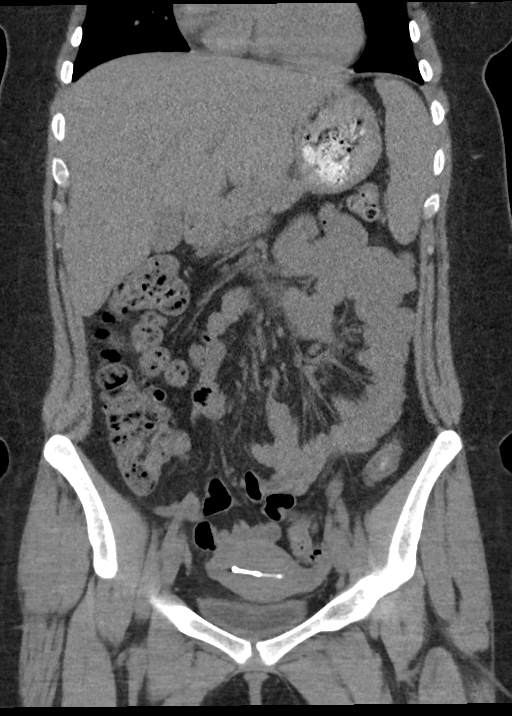
[im 45/100  soft-tissue]
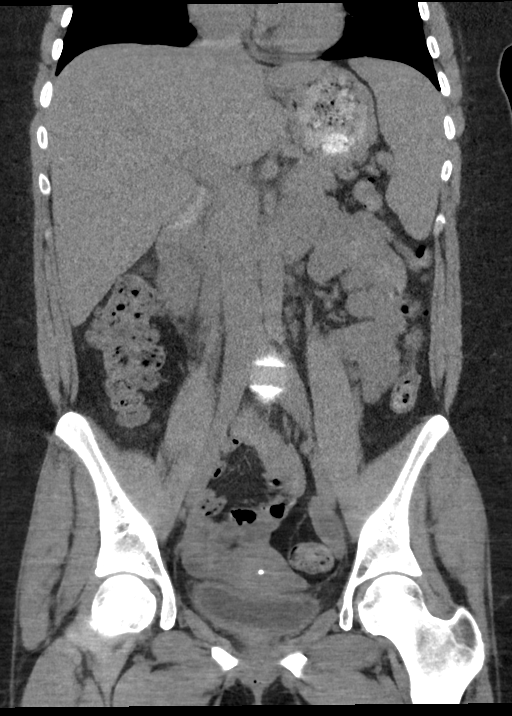
[im 45/100  bone]
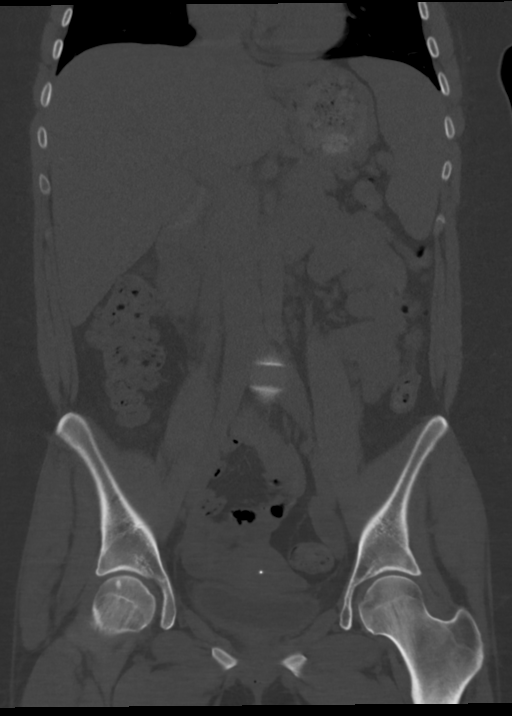
[im 56/100  soft-tissue]
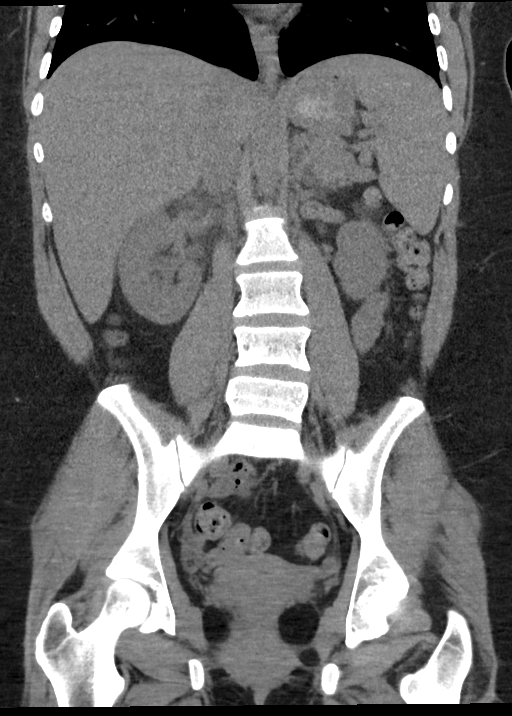

[Series 6: sagittal · sagittal · 0.54mm/px · 1 of 151 slices shown, 2 images]
[im 51/151  soft-tissue]
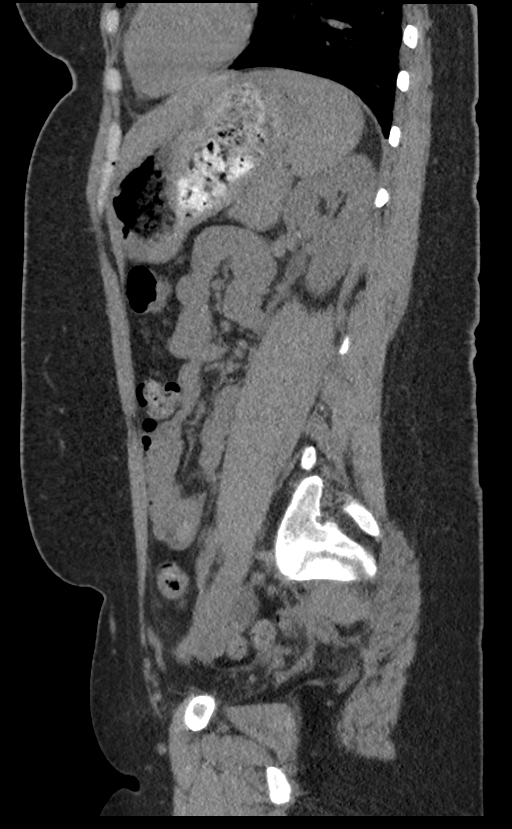
[im 51/151  bone]
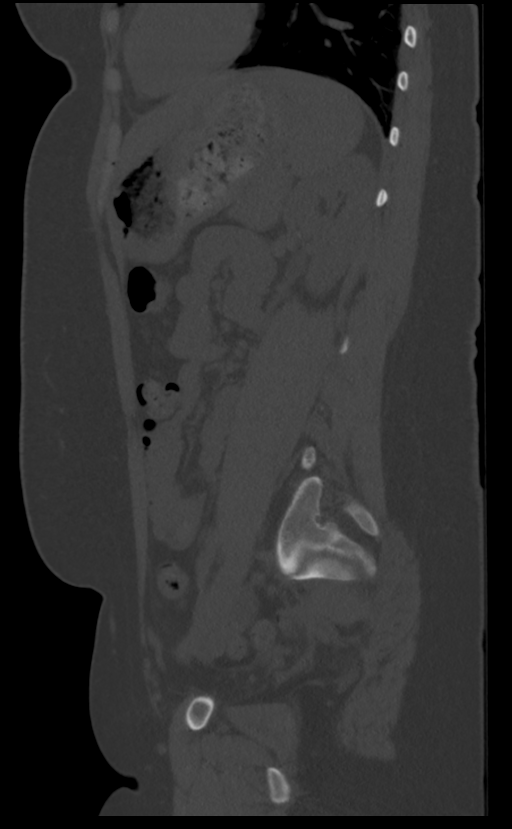

[8 of 46 positions shown; findings below may reference images not displayed]

FINDINGS: Lower chest: No acute abnormality.

Hepatobiliary: No focal liver abnormality is seen. No gallstones,
gallbladder wall thickening, or biliary dilatation.

Pancreas: Unremarkable. No pancreatic ductal dilatation or
surrounding inflammatory changes.

Spleen: Normal in size without focal abnormality.

Adrenals/Urinary Tract: Normal adrenal glands. Bilateral punctate
nonobstructing kidney stones. No left hydronephrosis.

Mild bladder wall thickening, mild right hydronephrosis, and right
kidney perinephric stranding. No obstructing ureter stone
identified.

Stomach/Bowel: Stomach is within normal limits. Appendix appears
normal. No evidence of bowel wall thickening, distention, or
inflammatory changes.

Vascular/Lymphatic: No significant vascular findings are present. No
enlarged abdominal or pelvic lymph nodes.

Reproductive: Uterus and bilateral adnexa are unremarkable.

Other: No abdominal wall hernia or abnormality. No abdominopelvic
ascites.

Musculoskeletal: No fracture is seen. Mild lumbar spine
levocurvature.
IMPRESSION: 1. Mild bladder wall thickening, mild right hydronephrosis, and
right kidney perinephric stranding. No obstructing ureter stone
identified. Findings may represent a recently passed stone or
infection of the collecting system.
2. Bilateral punctate nonobstructing kidney stones.

By: Eunise Regil M.D.

## 2018-12-28 IMAGING — US US RENAL
1 series · 14 of 25 positions shown · non-contrast
Comparison: None.

CLINICAL DATA: Thirty-two week pregnant patient with right flank
and lower abdominal pain times 12 hours.

EXAM:
RENAL / URINARY TRACT ULTRASOUND COMPLETE

[Series 1: us renal · 0.25mm/px · 14 of 49 slices shown]
[im 1/49]
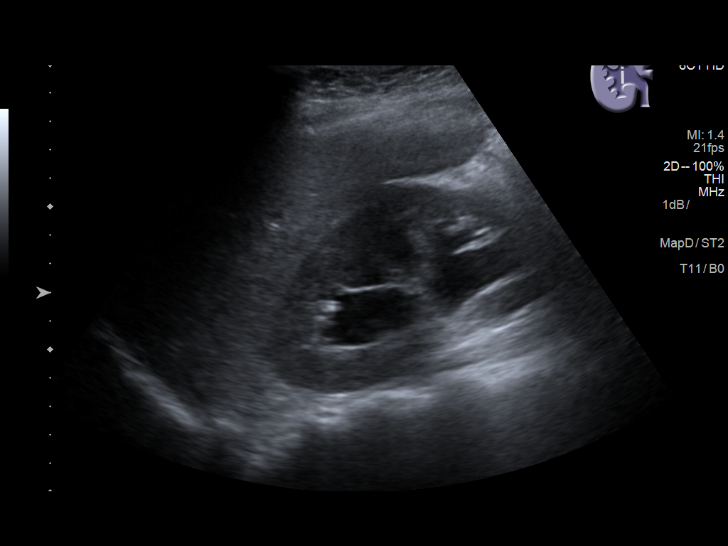
[im 5/49]
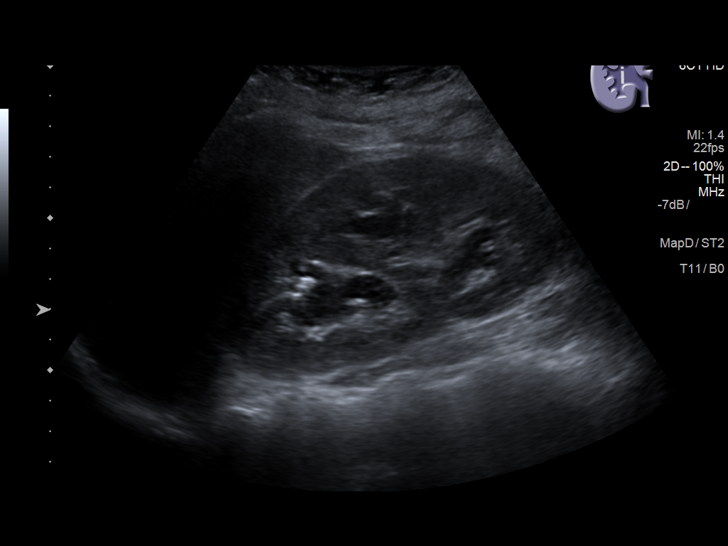
[im 9/49]
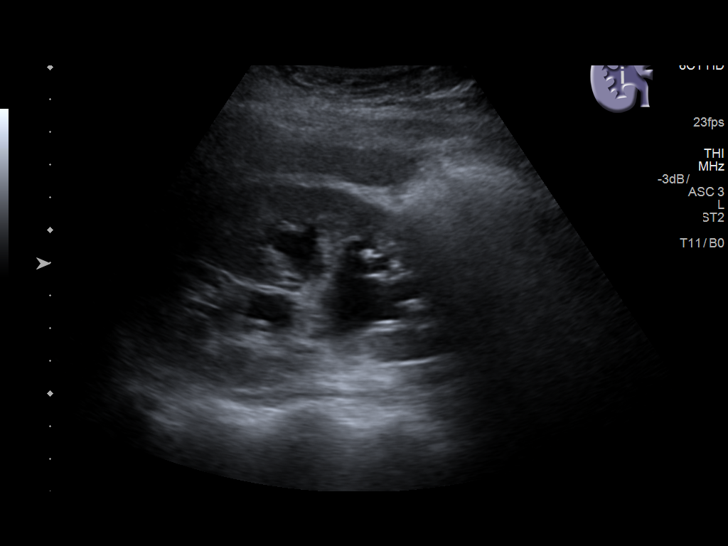
[im 13/49]
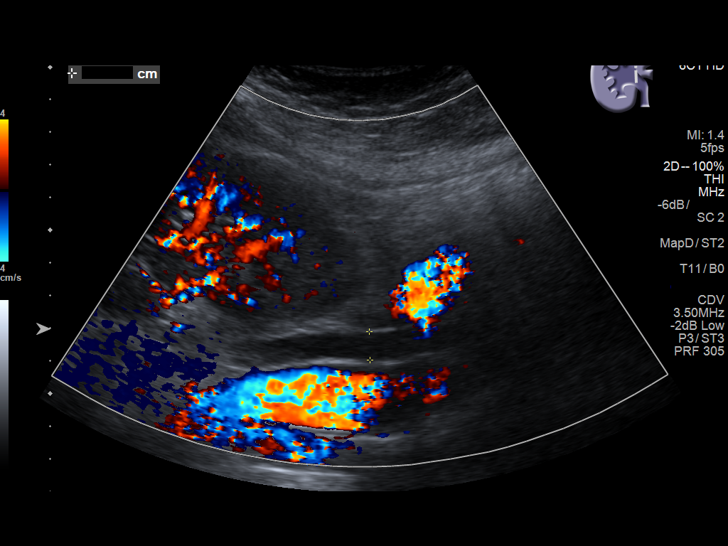
[im 17/49]
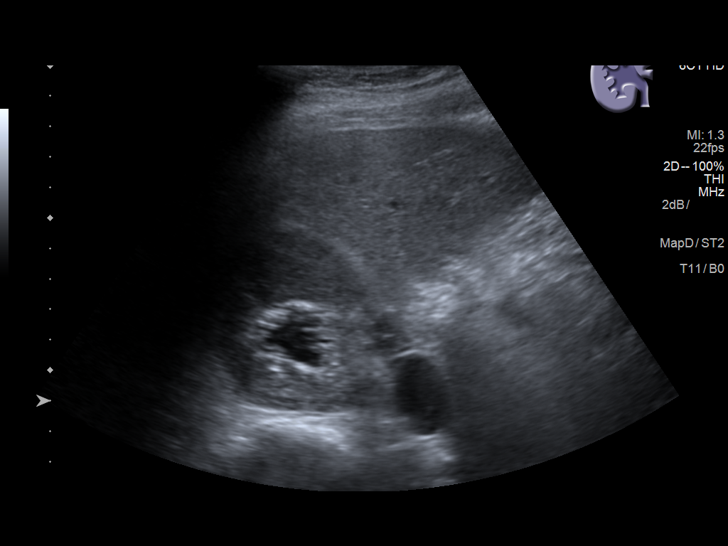
[im 19/49]
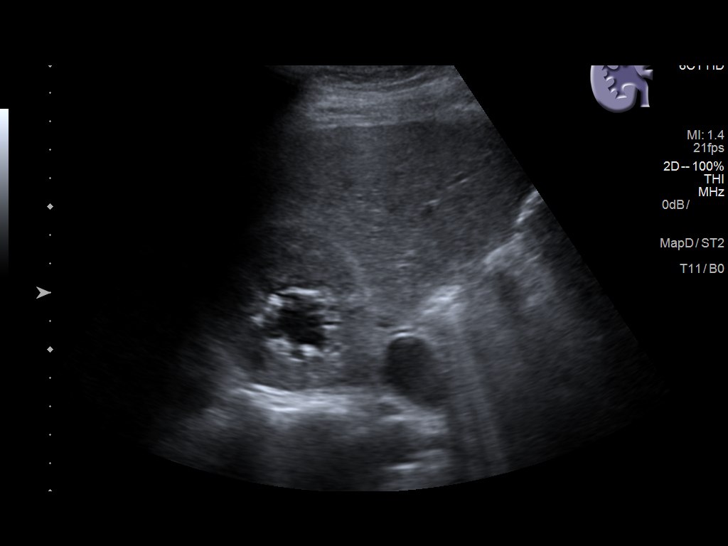
[im 23/49]
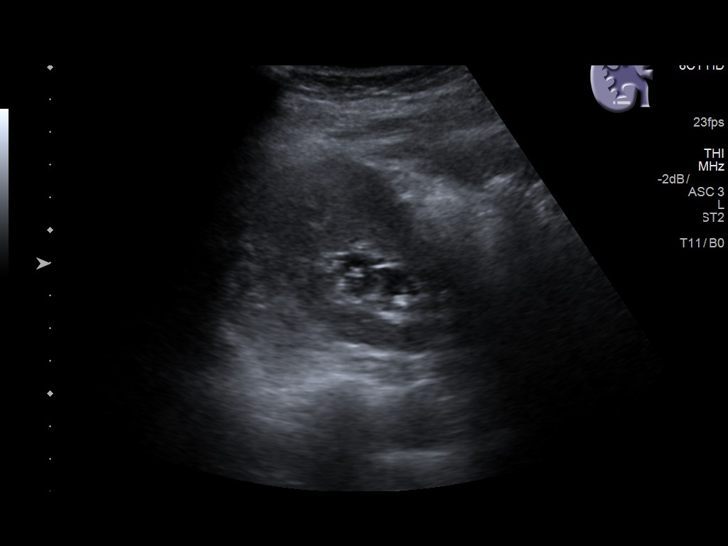
[im 27/49]
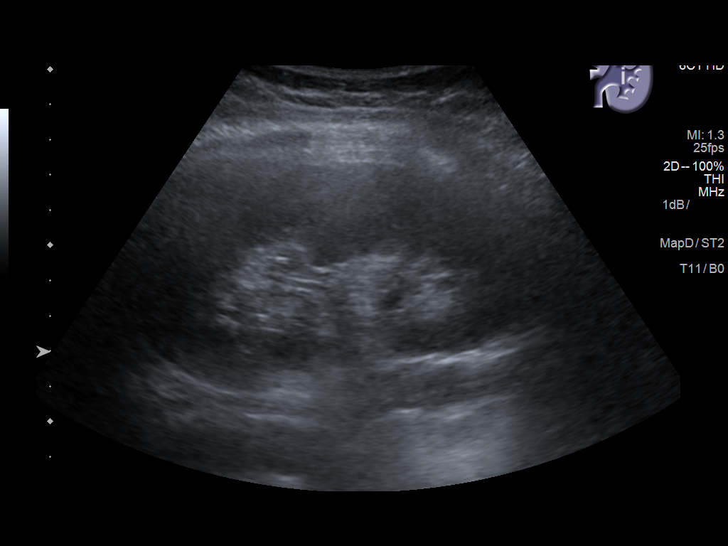
[im 31/49]
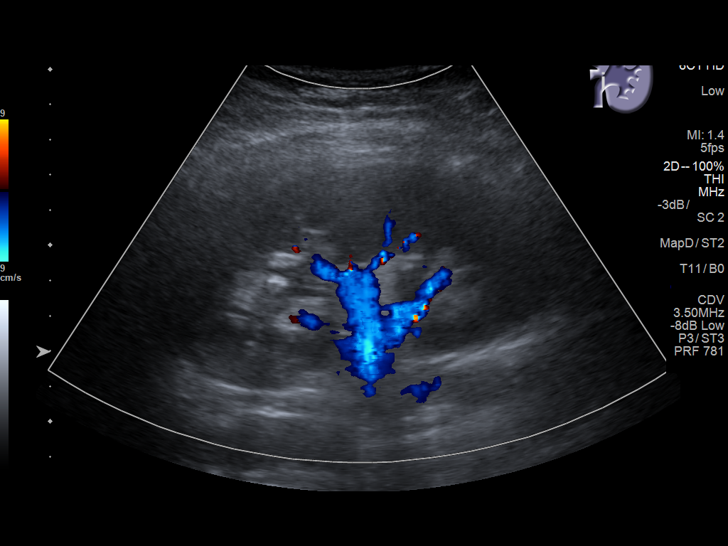
[im 33/49]
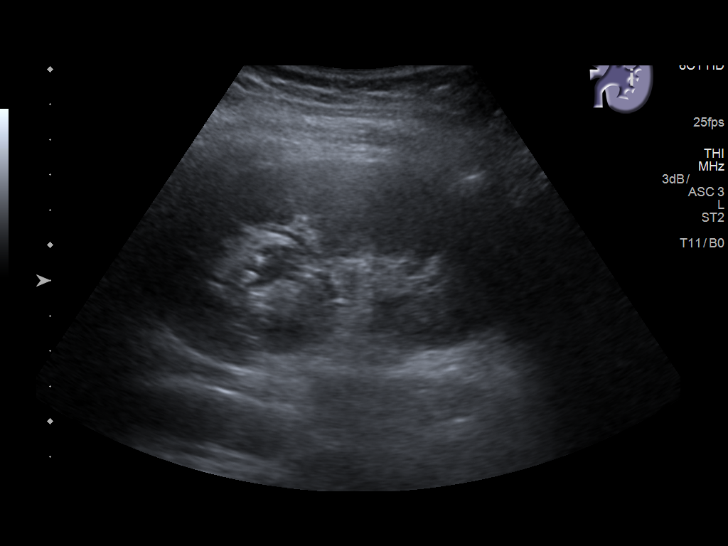
[im 37/49]
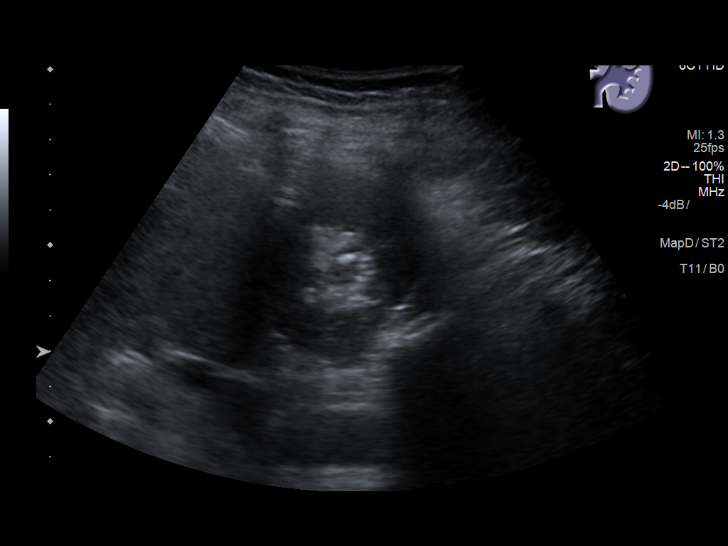
[im 41/49]
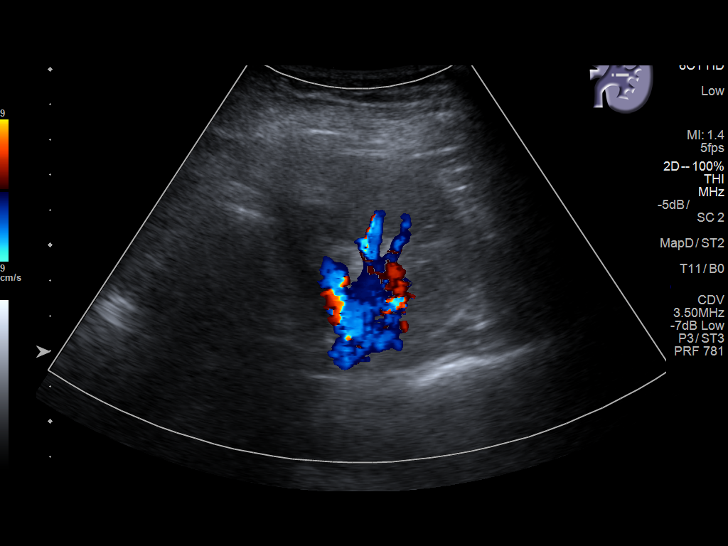
[im 45/49]
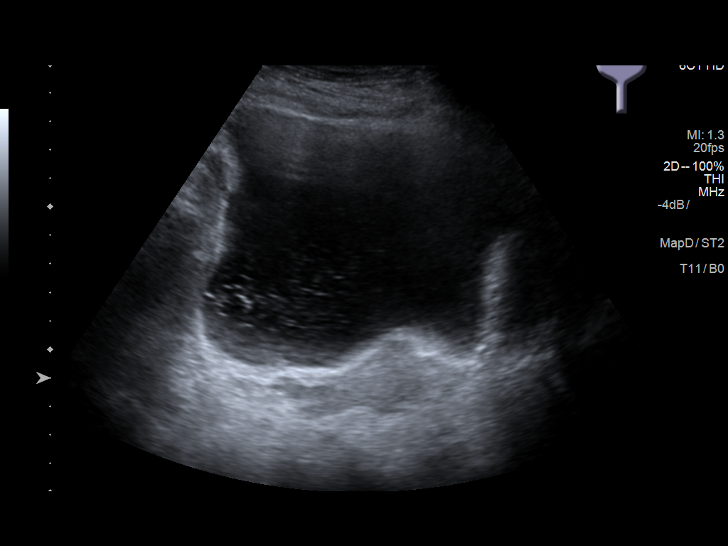
[im 49/49]
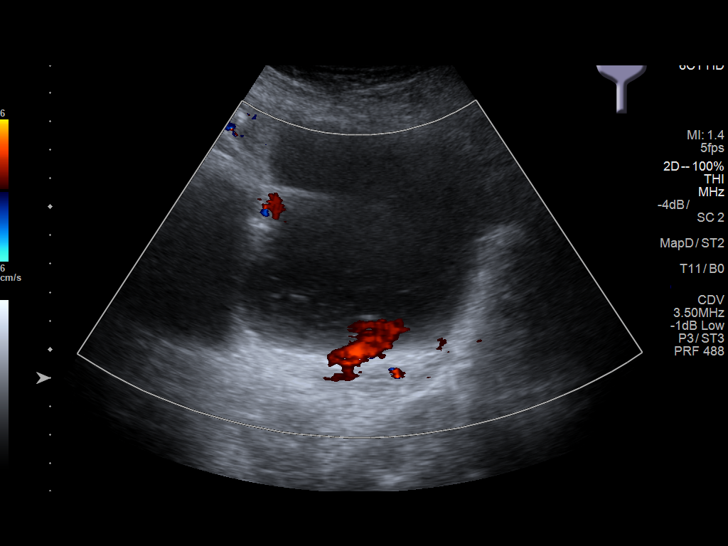

[14 of 25 positions shown; findings below may reference images not displayed]

FINDINGS: Right Kidney:

Length: 11.8 cm. There is moderate right-sided hydroureteronephrosis
with a nonobstructing renal calculi measuring up to 6 mm in the
upper and lower pole. No focal renal mass. Mild thinning of the
medullary compartment due to hydronephrosis.

Left Kidney:

Length: 11.3 cm. There is mild left-sided hydro nephrosis with
nonobstructing 4.8 mm calculus in the upper pole. Cortical-medullary
distinction is maintained. No renal mass.

Bladder:

Appears normal for degree of bladder distention. Bilateral ureteral
jets are demonstrated.
IMPRESSION: 1. Moderate right and mild left hydronephrosis and right-sided
hydroureter likely related to the gravid uterus as opposed to renal
stone disease given bilateral ureteral jets seen within the bladder
and which suggests patency of the ureters.
2. Bilateral nonobstructing renal calculi as above described
measuring up to 6 mm on the right and 4.8 mm on the left.

## 2023-01-20 ENCOUNTER — Other Ambulatory Visit: Payer: Self-pay | Admitting: Obstetrics and Gynecology
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
## 2023-02-16 ENCOUNTER — Other Ambulatory Visit: Payer: Self-pay

## 2023-02-16 ENCOUNTER — Encounter
Admission: RE | Admit: 2023-02-16 | Discharge: 2023-02-16 | Disposition: A | Discharge status: Home / Self Care | Payer: Medicaid Other | Source: Ambulatory Visit | Attending: Obstetrics and Gynecology | Admitting: Obstetrics and Gynecology

## 2023-02-16 HISTORY — DX: Gastro-esophageal reflux disease without esophagitis: K21.9

## 2023-02-16 HISTORY — DX: Personal history of urinary calculi: Z87.442

## 2023-02-16 NOTE — Patient Instructions (Signed)
Your procedure is scheduled on: Friday 02/26/23 To find out your arrival time, please call [phone] between 1PM - 3PM on:   Thursday 02/25/23 Report to the Registration Desk on the 1st floor of the [facility]. Valet parking is available.  If your arrival time is 6:00 am, do not arrive before that time as the [facility] entrance doors do not open until 6:00 am.  REMEMBER: Instructions that are not followed completely may result in serious medical risk, up to and including death; or upon the discretion of your surgeon and anesthesiologist your surgery may need to be rescheduled.  Do not eat food or drink any liquids after midnight the night before surgery.  No gum chewing or hard candies.  One week prior to surgery: Stop Anti-inflammatories (NSAIDS) such as Advil, Aleve, Ibuprofen, Motrin, Naproxen, Naprosyn and Aspirin based products such as Excedrin, Goody's Powder, BC Powder. You may however, continue to take Tylenol if needed for pain up until the day of surgery.  Stop ANY OVER THE COUNTER supplements or vitamins until after surgery.  Continue taking all prescribed medications.  TAKE ONLY THESE MEDICATIONS THE MORNING OF SURGERY WITH A SIP OF WATER:  famotidine (PEPCID) 20 MG tablet Antacid (take one the night before and one on the morning of surgery - helps to prevent nausea after surgery.)  Use inhalers (if needed) on the day of surgery and bring to the hospital.  No Alcohol for 24 hours before or after surgery.  No Smoking including e-cigarettes for 24 hours before surgery.  No chewable tobacco products for at least 6 hours before surgery.  No nicotine patches on the day of surgery.  Do not use any "recreational" drugs for at least a week (preferably 2 weeks) before your surgery.  Please be advised that the combination of cocaine and anesthesia may have negative outcomes, up to and including death. If you test positive for cocaine, your surgery will be cancelled.  On  the morning of surgery brush your teeth with toothpaste and water, you may rinse your mouth with mouthwash if you wish. Do not swallow any toothpaste or mouthwash.  Use CHG Soap or wipes as directed on instruction sheet.  Do not wear lotions, powders, or perfumes.   Do not shave body hair from the neck down 48 hours before surgery.  Wear comfortable clothing (specific to your surgery type) to the hospital.  Do not wear jewelry, make-up, hairpins, clips or nail polish.  Contact lenses, hearing aids and dentures may not be worn into surgery.  Do not bring valuables to the hospital. [facility] is not responsible for any missing/lost belongings or valuables.   Notify your doctor if there is any change in your medical condition (cold, fever, infection).  If you are being discharged the day of surgery, you will not be allowed to drive home. You will need a responsible individual to drive you home and stay with you for 24 hours after surgery.   If you are taking public transportation, you will need to have a responsible individual with you.  If you are being admitted to the hospital overnight, leave your suitcase in the car. After surgery it may be brought to your room.  In case of increased patient census, it may be necessary for you, the patient, to continue your postoperative care in the Same Day Surgery department.  After surgery, you can help prevent lung complications by doing breathing exercises.  Take deep breaths and cough every 1-2 hours. Your  doctor may order a device called an Incentive Spirometer to help you take deep breaths. When coughing or sneezing, hold a pillow firmly against your incision with both hands. This is called "splinting." Doing this helps protect your incision. It also decreases belly discomfort.  Surgery Visitation Policy:  Patients undergoing a surgery or procedure may have two family members or support persons with them as long as the person is not  COVID-19 positive or experiencing its symptoms.   Inpatient Visitation:    Visiting hours are 7 a.m. to 8 p.m. Up to four visitors are allowed at one time in a patient room. The visitors may rotate out with other people during the day. One designated support person (adult) may remain overnight.  Please call the Pre-admissions Testing Dept. at [phone] if you have any questions about these instructions.     Preparing for Surgery with CHLORHEXIDINE GLUCONATE (CHG) Soap  Chlorhexidine Gluconate (CHG) Soap  o An antiseptic cleaner that kills germs and bonds with the skin to continue killing germs even after washing  o Used for showering the night before surgery and morning of surgery  Before surgery, you can play an important role by reducing the number of germs on your skin.  CHG (Chlorhexidine gluconate) soap is an antiseptic cleanser which kills germs and bonds with the skin to continue killing germs even after washing.  Please do not use if you have an allergy to CHG or antibacterial soaps. If your skin becomes reddened/irritated stop using the CHG.  1. Shower the NIGHT BEFORE SURGERY and the MORNING OF SURGERY with CHG soap.  2. If you choose to wash your hair, wash your hair first as usual with your normal shampoo.  3. After shampooing, rinse your hair and body thoroughly to remove the shampoo.  4. Use CHG as you would any other liquid soap. You can apply CHG directly to the skin and wash gently with a scrungie or a clean washcloth.  5. Apply the CHG soap to your body only from the neck down. Do not use on open wounds or open sores. Avoid contact with your eyes, ears, mouth, and genitals (private parts). Wash face and genitals (private parts) with your normal soap.  6. Wash thoroughly, paying special attention to the area where your surgery will be performed.  7. Thoroughly rinse your body with warm water.  8. Do not shower/wash with your normal soap after using and  rinsing off the CHG soap.  9. Pat yourself dry with a clean towel.  10. Wear clean pajamas to bed the night before surgery.  12. Place clean sheets on your bed the night of your first shower and do not sleep with pets.  13. Shower again with the CHG soap on the day of surgery prior to arriving at the hospital.  14. Do not apply any deodorants/lotions/powders.  15. Please wear clean clothes to the hospital.

## 2023-02-17 ENCOUNTER — Encounter
Admission: RE | Admit: 2023-02-17 | Discharge: 2023-02-17 | Disposition: A | Discharge status: Home / Self Care | Payer: Medicaid Other | Source: Ambulatory Visit | Attending: Obstetrics and Gynecology | Admitting: Obstetrics and Gynecology

## 2023-02-17 DIAGNOSIS — R102 Pelvic and perineal pain: Secondary | ICD-10-CM | POA: Insufficient documentation

## 2023-02-17 DIAGNOSIS — Z01812 Encounter for preprocedural laboratory examination: Secondary | ICD-10-CM | POA: Insufficient documentation

## 2023-02-17 DIAGNOSIS — G8929 Other chronic pain: Secondary | ICD-10-CM | POA: Diagnosis not present

## 2023-02-17 DIAGNOSIS — N921 Excessive and frequent menstruation with irregular cycle: Secondary | ICD-10-CM | POA: Insufficient documentation

## 2023-02-17 LAB — TYPE AND SCREEN
ABO/RH(D): A POS
Antibody Screen: NEGATIVE

## 2023-02-17 LAB — CBC
HCT: 41.9 % (ref 36.0–46.0)
Hemoglobin: 13.9 g/dL (ref 12.0–15.0)
MCH: 28.2 pg (ref 26.0–34.0)
MCHC: 33.2 g/dL (ref 30.0–36.0)
MCV: 85 fL (ref 80.0–100.0)
Platelets: 343 10*3/uL (ref 150–400)
RBC: 4.93 MIL/uL (ref 3.87–5.11)
RDW: 13.2 % (ref 11.5–15.5)
WBC: 8.5 10*3/uL (ref 4.0–10.5)
nRBC: 0 % (ref 0.0–0.2)

## 2023-02-18 LAB — RPR: RPR Ser Ql: NONREACTIVE

## 2023-02-25 MED ORDER — CEFAZOLIN SODIUM-DEXTROSE 2-4 GM/100ML-% IV SOLN
2.0000 g | INTRAVENOUS | Status: AC
  Administered 2023-02-26: 2 g via INTRAVENOUS

## 2023-02-25 MED ORDER — SOD CITRATE-CITRIC ACID 500-334 MG/5ML PO SOLN
30.0000 mL | ORAL | Status: AC
  Administered 2023-02-26: 30 mL via ORAL
  Filled 2023-02-25: qty 30

## 2023-02-25 MED ORDER — CHLORHEXIDINE GLUCONATE 0.12 % MT SOLN
15.0000 mL | Freq: Once | OROMUCOSAL | Status: AC
  Administered 2023-02-26: 15 mL via OROMUCOSAL

## 2023-02-25 MED ORDER — SODIUM CHLORIDE 0.9 % IV SOLN: INTRAVENOUS | Status: DC

## 2023-02-25 MED ORDER — POVIDONE-IODINE 10 % EX SWAB: 2.0000 | Freq: Once | CUTANEOUS | Status: DC

## 2023-02-25 MED ORDER — ORAL CARE MOUTH RINSE: 15.0000 mL | Freq: Once | OROMUCOSAL | Status: AC

## 2023-02-26 ENCOUNTER — Encounter: Payer: Self-pay | Admitting: Obstetrics and Gynecology

## 2023-02-26 ENCOUNTER — Ambulatory Visit: Payer: Medicaid Other | Admitting: Anesthesiology

## 2023-02-26 ENCOUNTER — Other Ambulatory Visit: Payer: Self-pay

## 2023-02-26 ENCOUNTER — Encounter
Admission: RE | Disposition: A | Discharge status: Home / Self Care | Payer: Self-pay | Source: Home / Self Care | Attending: Obstetrics and Gynecology

## 2023-02-26 ENCOUNTER — Ambulatory Visit
Admission: RE | Admit: 2023-02-26 | Discharge: 2023-02-26 | Disposition: A | Discharge status: Home / Self Care | Payer: Medicaid Other | Attending: Obstetrics and Gynecology | Admitting: Obstetrics and Gynecology

## 2023-02-26 DIAGNOSIS — N9489 Other specified conditions associated with female genital organs and menstrual cycle: Secondary | ICD-10-CM | POA: Diagnosis not present

## 2023-02-26 DIAGNOSIS — D649 Anemia, unspecified: Secondary | ICD-10-CM | POA: Insufficient documentation

## 2023-02-26 DIAGNOSIS — Z87891 Personal history of nicotine dependence: Secondary | ICD-10-CM | POA: Diagnosis not present

## 2023-02-26 DIAGNOSIS — N921 Excessive and frequent menstruation with irregular cycle: Secondary | ICD-10-CM | POA: Diagnosis present

## 2023-02-26 DIAGNOSIS — Z6841 Body Mass Index (BMI) 40.0 and over, adult: Secondary | ICD-10-CM | POA: Insufficient documentation

## 2023-02-26 DIAGNOSIS — K219 Gastro-esophageal reflux disease without esophagitis: Secondary | ICD-10-CM | POA: Diagnosis not present

## 2023-02-26 DIAGNOSIS — F419 Anxiety disorder, unspecified: Secondary | ICD-10-CM | POA: Diagnosis not present

## 2023-02-26 DIAGNOSIS — R102 Pelvic and perineal pain unspecified side: Secondary | ICD-10-CM | POA: Diagnosis present

## 2023-02-26 DIAGNOSIS — F32A Depression, unspecified: Secondary | ICD-10-CM | POA: Insufficient documentation

## 2023-02-26 DIAGNOSIS — J45909 Unspecified asthma, uncomplicated: Secondary | ICD-10-CM | POA: Insufficient documentation

## 2023-02-26 DIAGNOSIS — Z87442 Personal history of urinary calculi: Secondary | ICD-10-CM | POA: Insufficient documentation

## 2023-02-26 DIAGNOSIS — G8929 Other chronic pain: Secondary | ICD-10-CM | POA: Insufficient documentation

## 2023-02-26 HISTORY — PX: CYSTOSCOPY: SHX5120

## 2023-02-26 HISTORY — PX: ROBOTIC ASSISTED LAPAROSCOPIC HYSTERECTOMY AND SALPINGECTOMY: SHX6379

## 2023-02-26 LAB — POCT PREGNANCY, URINE: Preg Test, Ur: NEGATIVE

## 2023-02-26 SURGERY — XI ROBOTIC ASSISTED LAPAROSCOPIC HYSTERECTOMY AND SALPINGECTOMY
Anesthesia: General | Site: Pelvis

## 2023-02-26 MED ORDER — 0.9 % SODIUM CHLORIDE (POUR BTL) OPTIME
TOPICAL | Status: DC | PRN
  Administered 2023-02-26: 500 mL

## 2023-02-26 MED ORDER — PHENYLEPHRINE 80 MCG/ML (10ML) SYRINGE FOR IV PUSH (FOR BLOOD PRESSURE SUPPORT)
PREFILLED_SYRINGE | INTRAVENOUS | Status: AC
  Filled 2023-02-26: qty 10

## 2023-02-26 MED ORDER — OXYCODONE HCL 5 MG PO TABS
5.0000 mg | ORAL_TABLET | Freq: Once | ORAL | Status: AC
  Administered 2023-02-26: 5 mg via ORAL

## 2023-02-26 MED ORDER — CEFAZOLIN SODIUM-DEXTROSE 2-4 GM/100ML-% IV SOLN
INTRAVENOUS | Status: AC
  Filled 2023-02-26: qty 100

## 2023-02-26 MED ORDER — ROCURONIUM BROMIDE 100 MG/10ML IV SOLN
INTRAVENOUS | Status: DC | PRN
  Administered 2023-02-26: 30 mg via INTRAVENOUS
  Administered 2023-02-26: 20 mg via INTRAVENOUS
  Administered 2023-02-26: 30 mg via INTRAVENOUS
  Administered 2023-02-26: 50 mg via INTRAVENOUS

## 2023-02-26 MED ORDER — CHLORHEXIDINE GLUCONATE 0.12 % MT SOLN
OROMUCOSAL | Status: AC
  Filled 2023-02-26: qty 15

## 2023-02-26 MED ORDER — DEXAMETHASONE SODIUM PHOSPHATE 10 MG/ML IJ SOLN
INTRAMUSCULAR | Status: AC
  Filled 2023-02-26: qty 1

## 2023-02-26 MED ORDER — PHENYLEPHRINE HCL (PRESSORS) 10 MG/ML IV SOLN
INTRAVENOUS | Status: DC | PRN
  Administered 2023-02-26 (×4): 80 ug via INTRAVENOUS

## 2023-02-26 MED ORDER — IBUPROFEN 600 MG PO TABS
600.0000 mg | ORAL_TABLET | Freq: Four times a day (QID) | ORAL | 0 refills | Status: AC
Start: 2023-02-26 — End: ?

## 2023-02-26 MED ORDER — LIDOCAINE HCL (CARDIAC) PF 100 MG/5ML IV SOSY
PREFILLED_SYRINGE | INTRAVENOUS | Status: DC | PRN
  Administered 2023-02-26: 100 mg via INTRAVENOUS

## 2023-02-26 MED ORDER — OXYCODONE HCL 5 MG PO TABS
ORAL_TABLET | ORAL | Status: AC
  Filled 2023-02-26: qty 1

## 2023-02-26 MED ORDER — FENTANYL CITRATE (PF) 100 MCG/2ML IJ SOLN
INTRAMUSCULAR | Status: DC | PRN
  Administered 2023-02-26 (×5): 50 ug via INTRAVENOUS

## 2023-02-26 MED ORDER — SODIUM CHLORIDE 0.9 % IR SOLN
Status: DC | PRN
  Administered 2023-02-26: 1000 mL

## 2023-02-26 MED ORDER — PROPOFOL 10 MG/ML IV BOLUS
INTRAVENOUS | Status: DC | PRN
  Administered 2023-02-26: 150 mg via INTRAVENOUS
  Administered 2023-02-26: 50 ug/kg/min via INTRAVENOUS

## 2023-02-26 MED ORDER — ONDANSETRON 4 MG PO TBDP
4.0000 mg | ORAL_TABLET | Freq: Four times a day (QID) | ORAL | 0 refills | Status: AC | PRN
Start: 2023-02-26 — End: ?

## 2023-02-26 MED ORDER — LIDOCAINE HCL (PF) 2 % IJ SOLN
INTRAMUSCULAR | Status: AC
  Filled 2023-02-26: qty 5

## 2023-02-26 MED ORDER — OXYCODONE-ACETAMINOPHEN 5-325 MG PO TABS: 1.0000 | ORAL_TABLET | Freq: Four times a day (QID) | ORAL | 0 refills | Status: AC | PRN

## 2023-02-26 MED ORDER — ONDANSETRON HCL 4 MG/2ML IJ SOLN
INTRAMUSCULAR | Status: DC | PRN
  Administered 2023-02-26: 4 mg via INTRAVENOUS

## 2023-02-26 MED ORDER — SEVOFLURANE IN SOLN
RESPIRATORY_TRACT | Status: AC
  Filled 2023-02-26: qty 250

## 2023-02-26 MED ORDER — ONDANSETRON HCL 4 MG/2ML IJ SOLN
INTRAMUSCULAR | Status: AC
  Filled 2023-02-26: qty 2

## 2023-02-26 MED ORDER — ACETAMINOPHEN 10 MG/ML IV SOLN
INTRAVENOUS | Status: DC | PRN
  Administered 2023-02-26: 1000 mg via INTRAVENOUS

## 2023-02-26 MED ORDER — KETOROLAC TROMETHAMINE 30 MG/ML IJ SOLN
INTRAMUSCULAR | Status: AC
  Filled 2023-02-26: qty 1

## 2023-02-26 MED ORDER — BUPIVACAINE HCL (PF) 0.5 % IJ SOLN
INTRAMUSCULAR | Status: AC
  Filled 2023-02-26: qty 30

## 2023-02-26 MED ORDER — FENTANYL CITRATE (PF) 250 MCG/5ML IJ SOLN
INTRAMUSCULAR | Status: AC
  Filled 2023-02-26: qty 5

## 2023-02-26 MED ORDER — MIDAZOLAM HCL 2 MG/2ML IJ SOLN
INTRAMUSCULAR | Status: DC | PRN
  Administered 2023-02-26: 2 mg via INTRAVENOUS

## 2023-02-26 MED ORDER — LACTATED RINGERS IV SOLN: INTRAVENOUS | Status: DC | PRN

## 2023-02-26 MED ORDER — BUPIVACAINE HCL 0.5 % IJ SOLN
INTRAMUSCULAR | Status: DC | PRN
  Administered 2023-02-26: 4 mL

## 2023-02-26 MED ORDER — PROMETHAZINE HCL 25 MG/ML IJ SOLN: 6.2500 mg | INTRAMUSCULAR | Status: DC | PRN

## 2023-02-26 MED ORDER — PROPOFOL 1000 MG/100ML IV EMUL
INTRAVENOUS | Status: AC
  Filled 2023-02-26: qty 100

## 2023-02-26 MED ORDER — ROCURONIUM BROMIDE 10 MG/ML (PF) SYRINGE
PREFILLED_SYRINGE | INTRAVENOUS | Status: AC
  Filled 2023-02-26: qty 10

## 2023-02-26 MED ORDER — SUGAMMADEX SODIUM 200 MG/2ML IV SOLN
INTRAVENOUS | Status: DC | PRN
  Administered 2023-02-26: 200 mg via INTRAVENOUS

## 2023-02-26 MED ORDER — KETOROLAC TROMETHAMINE 30 MG/ML IJ SOLN
INTRAMUSCULAR | Status: DC | PRN
  Administered 2023-02-26: 30 mg via INTRAVENOUS

## 2023-02-26 MED ORDER — MIDAZOLAM HCL 2 MG/2ML IJ SOLN
INTRAMUSCULAR | Status: AC
  Filled 2023-02-26: qty 2

## 2023-02-26 MED ORDER — ACETAMINOPHEN 10 MG/ML IV SOLN
INTRAVENOUS | Status: AC
  Filled 2023-02-26: qty 100

## 2023-02-26 MED ORDER — DEXAMETHASONE SODIUM PHOSPHATE 10 MG/ML IJ SOLN
INTRAMUSCULAR | Status: DC | PRN
  Administered 2023-02-26: 10 mg via INTRAVENOUS

## 2023-02-26 MED ORDER — DROPERIDOL 2.5 MG/ML IJ SOLN: 0.6250 mg | Freq: Once | INTRAMUSCULAR | Status: DC | PRN

## 2023-02-26 MED ORDER — HYDROMORPHONE HCL 1 MG/ML IJ SOLN: 0.2500 mg | INTRAMUSCULAR | Status: DC | PRN

## 2023-02-26 SURGICAL SUPPLY — 74 items
ADH SKN CLS APL DERMABOND .7 (GAUZE/BANDAGES/DRESSINGS) ×2
BAG DRN RND TRDRP ANRFLXCHMBR (UROLOGICAL SUPPLIES) ×2
BAG URINE DRAIN 2000ML AR STRL (UROLOGICAL SUPPLIES) ×2 IMPLANT
BASIN KIT SINGLE STR (MISCELLANEOUS) ×2 IMPLANT
BLADE SURG 15 STRL LF DISP TIS (BLADE) ×2 IMPLANT
BLADE SURG 15 STRL SS (BLADE) ×2
BLADE SURG SZ11 CARB STEEL (BLADE) ×2 IMPLANT
CANNULA CAP OBTURATR AIRSEAL 8 (CAP) ×2 IMPLANT
CATH FOLEY 2WAY  5CC 16FR (CATHETERS) ×2
CATH FOLEY 2WAY 5CC 16FR (CATHETERS) ×2
CATH URTH 16FR FL 2W BLN LF (CATHETERS) ×2 IMPLANT
COVER TIP SHEARS 8 DVNC (MISCELLANEOUS) ×2 IMPLANT
DERMABOND ADVANCED .7 DNX12 (GAUZE/BANDAGES/DRESSINGS) ×2 IMPLANT
DRAPE 3/4 80X56 (DRAPES) IMPLANT
DRAPE ARM DVNC X/XI (DISPOSABLE) ×8 IMPLANT
DRAPE ROBOT W/ LEGGING 30X125 (DRAPES) ×2 IMPLANT
DRAPE UNDER BUTTOCK W/FLU (DRAPES) ×2 IMPLANT
DRIVER NDL MEGA SUTCUT DVNCXI (INSTRUMENTS) ×2 IMPLANT
DRIVER NDLE MEGA SUTCUT DVNCXI (INSTRUMENTS) ×2 IMPLANT
ELECT REM PT RETURN 9FT ADLT (ELECTROSURGICAL) ×2
ELECTRODE REM PT RTRN 9FT ADLT (ELECTROSURGICAL) ×2 IMPLANT
FORCEPS BPLR R/ABLATION 8 DVNC (INSTRUMENTS) ×2 IMPLANT
GAUZE 4X4 16PLY ~~LOC~~+RFID DBL (SPONGE) ×2 IMPLANT
GLOVE BIO SURGEON STRL SZ7 (GLOVE) ×6 IMPLANT
GLOVE BIOGEL PI IND STRL 7.5 (GLOVE) ×6 IMPLANT
GOWN STRL REUS W/ TWL LRG LVL3 (GOWN DISPOSABLE) ×6 IMPLANT
GOWN STRL REUS W/ TWL XL LVL3 (GOWN DISPOSABLE) ×2 IMPLANT
GOWN STRL REUS W/TWL LRG LVL3 (GOWN DISPOSABLE) ×6
GOWN STRL REUS W/TWL XL LVL3 (GOWN DISPOSABLE) ×2
IRRIGATION STRYKERFLOW (MISCELLANEOUS) IMPLANT
IRRIGATOR STRYKERFLOW (MISCELLANEOUS)
IRRIGATOR SUCT 8 DISP DVNC XI (IRRIGATION / IRRIGATOR) IMPLANT
IV LACTATED RINGERS 1000ML (IV SOLUTION) ×2 IMPLANT
IV NS 1000ML (IV SOLUTION) ×2
IV NS 1000ML BAXH (IV SOLUTION) ×2 IMPLANT
KIT PINK PAD W/HEAD ARE REST (MISCELLANEOUS) ×2
KIT PINK PAD W/HEAD ARM REST (MISCELLANEOUS) ×2 IMPLANT
KIT TURNOVER CYSTO (KITS) ×2 IMPLANT
LABEL OR SOLS (LABEL) ×2 IMPLANT
MANIFOLD NEPTUNE II (INSTRUMENTS) ×2 IMPLANT
MANIPULATOR VCARE LG CRV RETR (MISCELLANEOUS) IMPLANT
MANIPULATOR VCARE SML CRV RETR (MISCELLANEOUS) IMPLANT
MANIPULATOR VCARE STD CRV RETR (MISCELLANEOUS) IMPLANT
NDL HYPO 22X1.5 SAFETY MO (MISCELLANEOUS) ×2 IMPLANT
NEEDLE HYPO 22X1.5 SAFETY MO (MISCELLANEOUS) ×2 IMPLANT
NS IRRIG 500ML POUR BTL (IV SOLUTION) ×2 IMPLANT
OBTURATOR OPTICAL STND 8 DVNC (TROCAR) ×2
OBTURATOR OPTICALSTD 8 DVNC (TROCAR) ×2 IMPLANT
OCCLUDER COLPOPNEUMO (BALLOONS) ×2 IMPLANT
PACK LAP CHOLECYSTECTOMY (MISCELLANEOUS) ×2 IMPLANT
PAD PREP OB/GYN DISP 24X41 (PERSONAL CARE ITEMS) ×2 IMPLANT
RUMI II 3.0CM BLUE KOH-EFFICIE (DISPOSABLE) IMPLANT
SCISSORS MNPLR CVD DVNC XI (INSTRUMENTS) ×2 IMPLANT
SCRUB CHG 4% DYNA-HEX 4OZ (MISCELLANEOUS) ×2 IMPLANT
SEAL UNIV 5-12 XI (MISCELLANEOUS) ×6 IMPLANT
SEALER VESSEL EXT DVNC XI (MISCELLANEOUS) ×2 IMPLANT
SET CYSTO W/LG BORE CLAMP LF (SET/KITS/TRAYS/PACK) ×2 IMPLANT
SET TUBE FILTERED XL AIRSEAL (SET/KITS/TRAYS/PACK) ×2 IMPLANT
SOL ELECTROSURG ANTI STICK (MISCELLANEOUS) ×2
SOLUTION ELECTROSURG ANTI STCK (MISCELLANEOUS) ×2 IMPLANT
SPONGE T-LAP 18X18 ~~LOC~~+RFID (SPONGE) IMPLANT
SURGILUBE 2OZ TUBE FLIPTOP (MISCELLANEOUS) ×2 IMPLANT
SUT DVC VLOC 180 0 12IN GS21 (SUTURE) ×2
SUT MNCRL 4-0 (SUTURE) ×2
SUT MNCRL 4-0 27XMFL (SUTURE) ×2
SUT VIC AB 0 CT2 27 (SUTURE) ×2 IMPLANT
SUT VLOC 90 S/L VL9 GS22 (SUTURE) ×2 IMPLANT
SUTURE DVC VLC 180 0 12IN GS21 (SUTURE) IMPLANT
SUTURE MNCRL 4-0 27XMF (SUTURE) ×2 IMPLANT
SYR 10ML LL (SYRINGE) ×2 IMPLANT
SYR 50ML LL SCALE MARK (SYRINGE) ×2 IMPLANT
TIP UTERINE 6.7X6CM WHT DISP (MISCELLANEOUS) IMPLANT
TRAP FLUID SMOKE EVACUATOR (MISCELLANEOUS) ×2 IMPLANT
WATER STERILE IRR 500ML POUR (IV SOLUTION) ×2 IMPLANT

## 2023-02-26 NOTE — Addendum Note (Signed)
Addendum  created 02/26/23 1231 by Lysbeth Penner, CRNA   Flowsheet accepted, Intraprocedure Flowsheets edited

## 2023-02-26 NOTE — Anesthesia Preprocedure Evaluation (Addendum)
Anesthesia Evaluation  Patient identified by MRN, date of birth, ID band Patient awake    Reviewed: Allergy & Precautions, H&P , NPO status , Patient's Chart, lab work & pertinent test results  Airway Mallampati: I  TM Distance: >3 FB Neck ROM: full    Dental no notable dental hx.    Pulmonary former smoker   Pulmonary exam normal        Cardiovascular negative cardio ROS Normal cardiovascular exam     Neuro/Psych  PSYCHIATRIC DISORDERS Anxiety     negative neurological ROS     GI/Hepatic Neg liver ROS,GERD  Medicated and Controlled,,  Endo/Other    Morbid obesity  Renal/GU      Musculoskeletal   Abdominal  (+) + obese  Peds  Hematology negative hematology ROS (+)   Anesthesia Other Findings menorrhagia irregular cycle, chronic pelvic pain  Past Medical History: No date: Anemia No date: Anxiety No date: Asthma No date: Depression No date: GERD (gastroesophageal reflux disease) No date: History of kidney stones No date: Kidney stones No date: Seizures (HCC)  Past Surgical History: No date: TONSILLECTOMY No date: WISDOM TOOTH EXTRACTION  BMI    Body Mass Index: 44.97 kg/m      Reproductive/Obstetrics negative OB ROS                             Anesthesia Physical Anesthesia Plan  ASA: 3  Anesthesia Plan: General ETT   Post-op Pain Management: Tylenol PO (pre-op)* and Toradol IV (intra-op)*   Induction: Intravenous  PONV Risk Score and Plan: 4 or greater and Ondansetron, Dexamethasone, Midazolam, Propofol infusion and Droperidol  Airway Management Planned: Oral ETT  Additional Equipment:   Intra-op Plan:   Post-operative Plan: Extubation in OR  Informed Consent: I have reviewed the patients History and Physical, chart, labs and discussed the procedure including the risks, benefits and alternatives for the proposed anesthesia with the patient or authorized  representative who has indicated his/her understanding and acceptance.     Dental Advisory Given  Plan Discussed with: CRNA and Surgeon  Anesthesia Plan Comments:         Anesthesia Quick Evaluation

## 2023-02-26 NOTE — Transfer of Care (Signed)
Immediate Anesthesia Transfer of Care Note  Patient: Erica Gill  Procedure(s) Performed: XI ROBOTIC ASSISTED LAPAROSCOPIC HYSTERECTOMY AND SALPINGECTOMY (Bilateral: Pelvis) CYSTOSCOPY (Bladder)  Patient Location: PACU  Anesthesia Type:General  Level of Consciousness: awake, drowsy, and patient cooperative  Airway & Oxygen Therapy: Patient Spontanous Breathing  Post-op Assessment: Report given to RN and Post -op Vital signs reviewed and stable  Post vital signs: Reviewed and stable  Last Vitals:  Vitals Value Taken Time  BP 112/65 02/26/23 1113  Temp 36.1 C 02/26/23 1113  Pulse 86 02/26/23 1115  Resp 20 02/26/23 1115  SpO2 95 % 02/26/23 1115  Vitals shown include unvalidated device data.  Last Pain:  Vitals:   02/26/23 1113  TempSrc:   PainSc: 0-No pain         Complications: There were no known notable events for this encounter.

## 2023-02-26 NOTE — Discharge Instructions (Signed)

## 2023-02-26 NOTE — Anesthesia Procedure Notes (Signed)
Procedure Name: Intubation Date/Time: 02/26/2023 7:41 AM  Performed by: Lysbeth Penner, CRNAPre-anesthesia Checklist: Patient identified, Emergency Drugs available, Suction available and Patient being monitored Patient Re-evaluated:Patient Re-evaluated prior to induction Oxygen Delivery Method: Circle system utilized Preoxygenation: Pre-oxygenation with 100% oxygen Induction Type: IV induction Ventilation: Mask ventilation without difficulty Laryngoscope Size: McGraph and 3 Grade View: Grade III Tube type: Oral Tube size: 6.5 mm Number of attempts: 1 Airway Equipment and Method: Stylet and Oral airway Placement Confirmation: ETT inserted through vocal cords under direct vision, positive ETCO2 and breath sounds checked- equal and bilateral Tube secured with: Tape Dental Injury: Teeth and Oropharynx as per pre-operative assessment

## 2023-02-26 NOTE — Op Note (Signed)
Operative Note    Name: Erica Gill  Date of Service: 02/26/2023   DOB: Oct 02, 1996  MRN: [id number]    Pre-Operative Diagnosis:  1) Menorrhagia with irregular cycle 2) Chronic pelvic pain in female  Post-Operative Diagnosis:  1) Menorrhagia with irregular cycle 2) Chronic pelvic pain in female  Procedures:  1. Robot assisted Total Laparoscopic Hysterectomy, bilateral salpingectomy  2. Cystoscopy   Primary Surgeon: Thomasene Mohair, MD  Assistant Surgeon: Margaretmary Eddy, CNM   EBL: 30 mL   IVF: 900 mL   Urine output: 800 mL  Specimens:  1) Uterus, cervix, and bilateral fallopian tubes 2) Right pelvic brim peritoneum biopsy  Drains: none  Complications: None   Disposition: PACU   Condition: Stable   Findings:  1) Essentially normal appearing uterus, bilateral fallopian tubes, and ovaries with no obvious endometriosis 2) One area of cystic lesion along the right pelvic sidewall medial and superior to the infundibulopelvic ligament . 3) On cystoscopy, the bladder wall was intact with visualized expulsion of urine into the bladder from the ureteral orifices.   Procedure Summary:  The patient was taken to the operating room where general anesthesia was administered and found to be adequate. She was placed in the dorsal supine lithotomy position in Laurel stirrups and prepped and draped in the usual, sterile fashion. After a timeout was called an indwelling catheter was placed in her bladder.  A sterile speculum was placed in her vagina.  The anterior lip of the cervix was grasped with the single-tooth tenaculum.  The cervix was serially dilated to an 11 Pratt dilator.  The 3 cm RUMI device was placed in accordance to the manufacturer's recommendations.  The tenaculum and speculum were removed.   Attention was turned to the abdomen where after injection of local anesthetic, an 8 mm infraumbilical incision was made with the scalpel. Entry into the abdomen was obtained via  Optiview trocar technique (a blunt entry technique with camera visualization through the obturator upon entry). Verification of entry into the abdomen was obtained using opening pressures. The abdomen was insufflated with CO2. The camera was introduced through the trocar with verification of atraumatic entry.  Right and left abdominal entry sites were created after injection of local anesthetic about 8 cm lateral to the umbilical port in accordance with the Intuitive manufacturer's recommendations.  An additional port was placed 8 cm lateral to the right abdominal port with verification of clearance above the iliac crest by more than 2 cm.  The port sites were 8 mm.  The intuitive trochars were introduced under intra-abdominal camera visualization without difficulty   The XI robot was docked on the patient's left.  Clearance was verified from the patient's legs.  Through the umbilical port the camera was placed.  Through the port attached to arm 3 the monopolar scissors were placed.  Through the port attached to arm 4 the forced bipolar forceps were was placed.  The bipolar fenestrated forceps were attached to port 1.   An inspection was undertaken of the pelvis with the above-noted findings. After identifying the vesical-like tissue along the right pelvic brim, the peritoneum was elevated and using cold scissors, the peritoneum was entered and the peritoneum was the vesicular structures were removed in a circumferential fashion. Hemostasis was obtained at the peritoneal edges and no underlying structures were involved nor injured.  The specimen was hand off to the assistant through an operative port.   The bilateral ureters were identified and found to be well away  from the operative area of interest. The right fallopian tube was grasped at the fimbriated end and was transected along the mesosalpinx in a lateral-to-medial fashion.  The round ligament was cauterized and transected on the right. The broad  ligament was opened posteriorly and anteriorly.  The retroperitoneal space was divided until the ureter was identified and found to have a normal course.  The utero-ovarian ligament was transected. Tissue was divided along the right broad ligament to the level of the internal cervical os.  Bladder tissue was dissected off the lower uterine segment and cervix without difficulty. The right uterine artery was skeletonized and identified and after ligation was transected. The same procedure was carried out on the left side. The colpotomy was performed using monopolar electrocautery in a circumferential fashion following the KOH ring.  The uterus and fallopian tubes and cervix were removed through the vagina   Closure of the vaginal cuff was undertaken using the V-lock stitch in a running fashion.  All vascular pedicles were inspected and found to be hemostatic with lowering the intra-abdominal pressure to 5 mmHg.  All instruments removed from the robotic ports.  The robot was undocked from the patient.  The abdomen was then desufflated of CO2 with the aid of 5 deep breaths from anesthesia.  All trochars were then removed.  All skin incisions were closed using 4-0 Vicryl in a subcuticular fashion and reinforced using surgical skin glue.   Cystoscopy was undertaken at this point. The Foley catheter was removed and the 70 cystoscope was gently introduced through the urethra. The bladder survey was undertaken with efflux of urine from both orifices noted. There were no defects noted in the bladder wall. The cystoscope was utilized to mostly empty the bladder.  A speculum was placed in her vagina. The suture line was hemostatic and the vagina was inspected to verify that no instruments or sponges remained and this was verified to be clear.  The assistant aided in manipulating the uterus, removing the uterus, and closing the skin incisions.   The patient tolerated the procedure well.  Sponge, lap, needle, and  instrument counts were correct x 2.  VTE prophylaxis: SCDs. Antibiotic prophylaxis: Ancef 2 grams IV prior to skin incision. She was awakened in the operating room and was taken to the PACU in stable condition. The assistant was a CNM as no other available Sales promotion account executive was available.   Thomasene Mohair, MD, [facility] 02/26/2023 11:06 AM

## 2023-02-26 NOTE — Anesthesia Postprocedure Evaluation (Signed)
Anesthesia Post Note  Patient: Erica Gill  Procedure(s) Performed: XI ROBOTIC ASSISTED LAPAROSCOPIC HYSTERECTOMY AND SALPINGECTOMY (Bilateral: Pelvis) CYSTOSCOPY (Bladder)  Patient location during evaluation: PACU Anesthesia Type: General Level of consciousness: awake and alert Pain management: pain level controlled Vital Signs Assessment: post-procedure vital signs reviewed and stable Respiratory status: spontaneous breathing, nonlabored ventilation, respiratory function stable and patient connected to nasal cannula oxygen Cardiovascular status: blood pressure returned to baseline and stable Postop Assessment: no apparent nausea or vomiting Anesthetic complications: no  There were no known notable events for this encounter.   Last Vitals:  Vitals:   02/26/23 1143 02/26/23 1145  BP:  102/71  Pulse: 74 76  Resp: 13 10  Temp:  (!) 36.1 C  SpO2: 97% 96%    Last Pain:  Vitals:   02/26/23 1143  TempSrc:   PainSc: 4                  Stephanie Coup

## 2023-02-26 NOTE — Interval H&P Note (Signed)
History and Physical Interval Note:  02/26/2023 7:14 AM  Erica Gill  has presented today for surgery, with the diagnosis of menorrhagia irregular cycle, chronic pelvic pain.  The various methods of treatment have been discussed with the patient and family. After consideration of risks, benefits and other options for treatment, the patient has consented to  Procedure(s): XI ROBOTIC ASSISTED LAPAROSCOPIC HYSTERECTOMY AND SALPINGECTOMY (Bilateral) CYSTOSCOPY (N/A) as a surgical intervention.  The patient's history has been reviewed, patient examined, no change in status, stable for surgery.  I have reviewed the patient's chart and labs.  Questions were answered to the patient's satisfaction.  She remains sure that she would like to proceed.   Thomasene Mohair, MD, [facility] 02/26/2023 7:14 AM

## 2023-02-28 ENCOUNTER — Other Ambulatory Visit: Payer: Self-pay

## 2023-02-28 ENCOUNTER — Emergency Department: Payer: Medicaid Other

## 2023-02-28 ENCOUNTER — Emergency Department
Admission: EM | Admit: 2023-02-28 | Discharge: 2023-02-28 | Disposition: A | Discharge status: Home / Self Care | Payer: Medicaid Other | Attending: Emergency Medicine | Admitting: Emergency Medicine

## 2023-02-28 DIAGNOSIS — X58XXXA Exposure to other specified factors, initial encounter: Secondary | ICD-10-CM | POA: Insufficient documentation

## 2023-02-28 DIAGNOSIS — S4992XA Unspecified injury of left shoulder and upper arm, initial encounter: Secondary | ICD-10-CM | POA: Diagnosis present

## 2023-02-28 NOTE — ED Provider Notes (Signed)
[facility] Provider Note    Event Date/Time   First MD Initiated Contact with Patient 02/28/23 [phone]     (approximate)   History   Arm Injury   HPI  Erica Gill is a 27 y.o. female with a past medical history of chronic pelvic pain status post hysterectomy performed on 65/24/24 who presents today for evaluation of arm pain.  Patient reports that when she was discharged home she had swelling and discomfort of her right arm with a blood pressure cuff was squeezing her intermittently.  She reports that her mom died from a blood clot and this is what she is worried about.  She feels that she is mostly swollen in her upper arm or the blood pressure cuff was, and now has swelling throughout her arm.  She denies weakness or paresthesias.    Patient Active Problem List   Diagnosis Date Noted   Menorrhagia with irregular cycle 02/26/2023   Chronic pelvic pain in female 02/26/2023   Pregnancy 06/28/2017   Indication for care in labor and delivery, antepartum 06/28/2017   Kidney stones 05/14/2017   Labor and delivery, indication for care 05/06/2017          Physical Exam   Triage Vital Signs: ED Triage Vitals [02/28/23 0956]  Enc Vitals Group     BP      Pulse      Resp      Temp      Temp src      SpO2      Weight 238 lb 1.6 oz (108 kg)     Height 5\' 1"  (1.549 m)     Head Circumference      Peak Flow      Pain Score 2     Pain Loc      Pain Edu?      Excl. in GC?     Most recent vital signs: Vitals:   02/28/23 1000  BP: 123/75  Pulse: 63  Resp: 16  Temp: 97.9 F (36.6 C)  SpO2: 100%    Physical Exam Vitals and nursing note reviewed.  Constitutional:      General: Awake and alert. No acute distress.    Appearance: Normal appearance. The patient is obese.  HENT:     Head: Normocephalic and atraumatic.     Mouth: Mucous membranes are moist.  Eyes:     General: PERRL. Normal EOMs        Right eye: No discharge.        Left  eye: No discharge.     Conjunctiva/sclera: Conjunctivae normal.  Cardiovascular:     Rate and Rhythm: Normal rate and regular rhythm.     Pulses: Normal pulses.     Heart sounds: Normal heart sounds Pulmonary:     Effort: Pulmonary effort is normal. No respiratory distress.     Breath sounds: Normal breath sounds.  Abdominal:     Abdomen is soft. There is no abdominal tenderness. No rebound or guarding. No distention. Musculoskeletal:        General: No swelling. Normal range of motion.     Cervical back: Normal range of motion and neck supple.  Left upper arm: Faint nonblanching patches of erythema noted to the inner upper arm, with mild swelling in this area.  No blisters or bullae. No fluctuance. No appreciable swelling to lower arm or wrist/hand.  Normal radial pulse.  Sensation intact light touch throughout.  Normal grip strength.  Normal intrinsic muscle function of the hand. Skin:    General: Skin is warm and dry.     Capillary Refill: Capillary refill takes less than 2 seconds.     Findings: No rash.  Neurological:     Mental Status: The patient is awake and alert.      ED Results / Procedures / Treatments   Labs (all labs ordered are listed, but only abnormal results are displayed) Labs Reviewed - No data to display   EKG     RADIOLOGY I independently reviewed and interpreted imaging and agree with radiologists findings.     PROCEDURES:  Critical Care performed:   Procedures   MEDICATIONS ORDERED IN ED: Medications - No data to display   IMPRESSION / MDM / ASSESSMENT AND PLAN / ED COURSE  I reviewed the triage vital signs and the nursing notes.   Differential diagnosis includes, but is not limited to, soft tissue injury, contusion, DVT, hematoma.  I reviewed the patient's chart.  Patient had hysterectomy on 02/26/2023.  There appeared to be no complications during the surgery or during the anesthesia.  Patient is awake and alert, hemodynamically  stable and neurovascularly intact.  There is mild swelling in her upper arm where she reports that the blood pressure cuff was.  The IV was in the opposite arm.  Given her family history of blood clot, as well as her recent surgical procedure, will obtain ultrasound for evaluation of possible left upper extremity DVT.  Ultrasound is negative for DVT or other abnormality.  I suspect soft tissue injury.  I discussed with her symptomatic management with rest, elevation, and cool compresses.  No erythema, fluctuance, lymphangitis, crepitus.  Does not appear to be infectious in etiology.  We discussed return precautions as well.  Patient or stands and agrees with plan.  She was discharged in stable condition.   Patient's presentation is most consistent with acute complicated illness / injury requiring diagnostic workup.     FINAL CLINICAL IMPRESSION(S) / ED DIAGNOSES   Final diagnoses:  Injury of left upper extremity, initial encounter     Rx / DC Orders   ED Discharge Orders     None        Note:  This document was prepared using Dragon voice recognition software and may include unintentional dictation errors.   Keturah Shavers 02/28/23 1311    Georga Hacking, MD 02/28/23 1350

## 2023-02-28 NOTE — ED Triage Notes (Signed)
Reports had a hysterectomy yesterday and was d/c home and this am when she woke up she had some swelling and discomfort to her right arm where the BP cuff was squeezing her intermittently and wants it checked out.

## 2023-02-28 NOTE — Discharge Instructions (Signed)
Your ultrasound was negative for DVT.  Please rest, ice, elevate the area.  Please return for any new, worsening, or change in symptoms or other concerns.  It was a pleasure caring for you today.

## 2023-03-03 LAB — SURGICAL PATHOLOGY

## 2024-01-03 ENCOUNTER — Other Ambulatory Visit: Payer: Self-pay | Admitting: Orthopedic Surgery

## 2024-01-03 DIAGNOSIS — G8929 Other chronic pain: Secondary | ICD-10-CM

## 2024-01-04 ENCOUNTER — Ambulatory Visit
Admission: RE | Admit: 2024-01-04 | Discharge: 2024-01-04 | Disposition: A | Discharge status: Home / Self Care | Source: Ambulatory Visit | Attending: Orthopedic Surgery | Admitting: Orthopedic Surgery

## 2024-01-04 DIAGNOSIS — G8929 Other chronic pain: Secondary | ICD-10-CM | POA: Diagnosis present

## 2024-01-04 DIAGNOSIS — M5442 Lumbago with sciatica, left side: Secondary | ICD-10-CM | POA: Diagnosis present
